# Patient Record
Sex: Female | Born: 1985 | Race: Asian | Hispanic: No
Health system: Southern US, Community
[De-identification: ages and names within clinical notes are randomized; demographics above are authoritative.]

## PROBLEM LIST (undated history)

## (undated) DIAGNOSIS — E7800 Pure hypercholesterolemia, unspecified: Secondary | ICD-10-CM

## (undated) DIAGNOSIS — G43909 Migraine, unspecified, not intractable, without status migrainosus: Secondary | ICD-10-CM

## (undated) DIAGNOSIS — Z8742 Personal history of other diseases of the female genital tract: Secondary | ICD-10-CM

## (undated) HISTORY — DX: Personal history of other diseases of the female genital tract: Z87.42

## (undated) HISTORY — DX: Migraine, unspecified, not intractable, without status migrainosus: G43.909

## (undated) HISTORY — DX: Pure hypercholesterolemia, unspecified: E78.00

---
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
## 2008-12-12 ENCOUNTER — Inpatient Hospital Stay (HOSPITAL_COMMUNITY): Admission: AD | Admit: 2008-12-12 | Discharge: 2008-12-15 | Payer: Self-pay | Admitting: Obstetrics and Gynecology

## 2008-12-12 ENCOUNTER — Inpatient Hospital Stay (HOSPITAL_COMMUNITY): Admission: AD | Admit: 2008-12-12 | Discharge: 2008-12-12 | Payer: Self-pay | Admitting: Obstetrics & Gynecology

## 2010-05-29 LAB — CBC
HCT: 39.7 % (ref 36.0–46.0)
Hemoglobin: 11.1 g/dL — ABNORMAL LOW (ref 12.0–15.0)
Hemoglobin: 13.2 g/dL (ref 12.0–15.0)
MCHC: 33.2 g/dL (ref 30.0–36.0)
MCV: 91.7 fL (ref 78.0–100.0)
Platelets: 178 K/uL (ref 150–400)
RBC: 3.57 MIL/uL — ABNORMAL LOW (ref 3.87–5.11)
RBC: 4.33 MIL/uL (ref 3.87–5.11)
RDW: 13 % (ref 11.5–15.5)
WBC: 15.3 K/uL — ABNORMAL HIGH (ref 4.0–10.5)

## 2010-05-29 LAB — RPR: RPR Ser Ql: NONREACTIVE

## 2015-12-12 DIAGNOSIS — Z23 Encounter for immunization: Secondary | ICD-10-CM | POA: Diagnosis not present

## 2017-02-23 DIAGNOSIS — E7800 Pure hypercholesterolemia, unspecified: Secondary | ICD-10-CM

## 2017-02-23 HISTORY — DX: Pure hypercholesterolemia, unspecified: E78.00

## 2017-05-17 ENCOUNTER — Encounter: Payer: Self-pay | Admitting: *Deleted

## 2017-07-17 ENCOUNTER — Encounter: Payer: Self-pay | Admitting: Obstetrics and Gynecology

## 2017-07-23 ENCOUNTER — Encounter: Payer: Self-pay | Admitting: Obstetrics and Gynecology

## 2017-07-23 ENCOUNTER — Other Ambulatory Visit: Payer: Self-pay

## 2017-07-23 ENCOUNTER — Ambulatory Visit (INDEPENDENT_AMBULATORY_CARE_PROVIDER_SITE_OTHER): Payer: No Typology Code available for payment source | Admitting: Obstetrics and Gynecology

## 2017-07-23 VITALS — BP 118/62 | HR 60 | Resp 14 | Ht 63.25 in | Wt 145.0 lb

## 2017-07-23 DIAGNOSIS — Z01419 Encounter for gynecological examination (general) (routine) without abnormal findings: Secondary | ICD-10-CM | POA: Diagnosis not present

## 2017-07-23 DIAGNOSIS — Z113 Encounter for screening for infections with a predominantly sexual mode of transmission: Secondary | ICD-10-CM

## 2017-07-23 DIAGNOSIS — N926 Irregular menstruation, unspecified: Secondary | ICD-10-CM

## 2017-07-23 NOTE — Progress Notes (Signed)
32 y.o. G44P0001 Married [place] female here as a new patient for an annual exam. Patient has seen Dr. Quincy Simmonds in the past at [facility]. Patient complains of having vaginal discharge. Having odor.   Some irregular menses.  Had 5 - 8 menses this year.  This is long standing for the patient since her teen years.  Took 10 months to become pregnant.  Having cramping in the left groin area.  States it feels like a muscle.  States a history of hematuria, which resolved.  This was actually a UTI treated at urgent care.  Sits at a computer for work.  Son is now 56 yo.  Patient is married for one year.  Considering future childbearing.   Prior husband was unfaithful.  Did not have STD screening following this that she can confirm.   PCP: No PCP     Patient's last menstrual period was 07/17/2017.     Period Cycle (Days): (irregular) Period Duration (Days): 4-6 Period Pattern: (!) Irregular Menstrual Flow: Moderate, Heavy Menstrual Control: Maxi pad Menstrual Control Change Freq (Hours): 4-5 Dysmenorrhea: (!) Mild Dysmenorrhea Symptoms: Cramping     Sexually active: Yes.    The current method of family planning is none.    Exercising: Yes.    walking Smoker:  no  Health Maintenance: Pap:  Years ago - normal per patient  History of abnormal Pap:  no TDaP:  2017 per patient Gardasil:   unsure HIV: negative in pregnancy Hep C: unsure Screening Labs:  Discuss today   reports that she is a non-smoker but has been exposed to tobacco smoke. She has never used smokeless tobacco. She reports that she drank alcohol. She reports that she does not use drugs.  History reviewed. No pertinent past medical history.  History reviewed. No pertinent surgical history.  No current outpatient medications on file.   No current facility-administered medications for this visit.     Family History  Problem Relation Age of Onset  . Arthritis Father   . Polycystic ovary syndrome Sister   .  Polycystic ovary syndrome Sister   . Goiter Sister   . Thyroid disease Sister     Review of Systems  Constitutional: Negative.   HENT: Negative.   Eyes: Negative.   Respiratory: Negative.   Cardiovascular: Negative.   Gastrointestinal: Negative.   Endocrine: Negative.   Genitourinary: Positive for vaginal discharge.  Musculoskeletal: Negative.   Skin: Negative.   Allergic/Immunologic: Negative.   Neurological: Negative.   Hematological: Negative.   Psychiatric/Behavioral: Negative.     Exam:   BP 118/62 (BP Location: Right Arm, Patient Position: Sitting, Cuff Size: Normal)   Pulse 60   Resp 14   Ht 5' 3.25" (1.607 m)   Wt 145 lb (65.8 kg)   LMP 07/17/2017   BMI 25.48 kg/m     General appearance: alert, cooperative and appears stated age Head: Normocephalic, without obvious abnormality, atraumatic Neck: no adenopathy, supple, symmetrical, trachea midline and thyroid normal to inspection and palpation Lungs: clear to auscultation bilaterally Breasts: normal appearance, no masses or tenderness, No nipple retraction or dimpling, No nipple discharge or bleeding, No axillary or supraclavicular adenopathy Heart: regular rate and rhythm Abdomen: soft, non-tender; no masses, no organomegaly Extremities: extremities normal, atraumatic, no cyanosis or edema Skin: Skin color, texture, turgor normal. No rashes or lesions Lymph nodes: Cervical, supraclavicular, and axillary nodes normal. No abnormal inguinal nodes palpated Neurologic: Grossly normal  Pelvic: External genitalia:  no lesions  Urethra:  normal appearing urethra with no masses, tenderness or lesions              Bartholins and Skenes: normal                 Vagina: normal appearing vagina with normal color and discharge, no lesions              Cervix:  Not well seen due to heavy bleeding from menses.               Pap taken: No. Bimanual Exam:  Uterus:  normal size, contour, position, consistency,  mobility, non-tender              Adnexa: no mass, fullness, tenderness              Limited bimanual exam due to some discomfort.                Chaperone was present for exam.  Assessment:   Well woman visit with normal exam. Vaginal discharge.  Irregular menses.  STD screening.  Considering future childbearing.   Plan: Mammogram screening. Recommended self breast awareness. Pap and HR HPV with GC/CT/vaginitis testing upon return in one week.  Guidelines for Calcium, Vitamin D, regular exercise program including cardiovascular and weight bearing exercise. Start PNV.  Serum STD testing, routine labs, prolactin, TSH, LH/FSH.  Discussed Gardasil. Return in one week for pap/vaginitis and GC/CT evaluation.  Follow up annually and prn.   After visit summary provided.

## 2017-07-23 NOTE — Patient Instructions (Signed)

## 2017-07-24 LAB — COMPREHENSIVE METABOLIC PANEL
ALK PHOS: 70 IU/L (ref 39–117)
ALT: 18 IU/L (ref 0–32)
AST: 17 IU/L (ref 0–40)
Albumin/Globulin Ratio: 1.4 (ref 1.2–2.2)
Albumin: 4.6 g/dL (ref 3.5–5.5)
BILIRUBIN TOTAL: 0.5 mg/dL (ref 0.0–1.2)
BUN/Creatinine Ratio: 18 (ref 9–23)
BUN: 14 mg/dL (ref 6–20)
CHLORIDE: 101 mmol/L (ref 96–106)
CO2: 23 mmol/L (ref 20–29)
CREATININE: 0.77 mg/dL (ref 0.57–1.00)
Calcium: 9.6 mg/dL (ref 8.7–10.2)
GFR calc Af Amer: 120 mL/min/{1.73_m2} (ref >59)
GFR calc non Af Amer: 104 mL/min/{1.73_m2} (ref >59)
GLUCOSE: 93 mg/dL (ref 65–99)
Globulin, Total: 3.3 g/dL (ref 1.5–4.5)
Potassium: 4.1 mmol/L (ref 3.5–5.2)
Sodium: 139 mmol/L (ref 134–144)
Total Protein: 7.9 g/dL (ref 6.0–8.5)

## 2017-07-24 LAB — LIPID PANEL
CHOL/HDL RATIO: 5.1 ratio — AB (ref 0.0–4.4)
Cholesterol, Total: 208 mg/dL — ABNORMAL HIGH (ref 100–199)
HDL: 41 mg/dL (ref >39)
LDL CALC: 141 mg/dL — AB (ref 0–99)
TRIGLYCERIDES: 131 mg/dL (ref 0–149)
VLDL CHOLESTEROL CAL: 26 mg/dL (ref 5–40)

## 2017-07-24 LAB — CBC
Hematocrit: 40.7 % (ref 34.0–46.6)
Hemoglobin: 13.6 g/dL (ref 11.1–15.9)
MCH: 28.1 pg (ref 26.6–33.0)
MCHC: 33.4 g/dL (ref 31.5–35.7)
MCV: 84 fL (ref 79–97)
PLATELETS: 371 10*3/uL (ref 150–450)
RBC: 4.84 x10E6/uL (ref 3.77–5.28)
RDW: 13.9 % (ref 12.3–15.4)
WBC: 9.2 10*3/uL (ref 3.4–10.8)

## 2017-07-24 LAB — FSH/LH
FSH: 5.1 m[IU]/mL
LH: 14.5 m[IU]/mL

## 2017-07-24 LAB — TSH: TSH: 1.72 u[IU]/mL (ref 0.450–4.500)

## 2017-07-24 LAB — HEPATITIS C ANTIBODY: Hep C Virus Ab: <0.1 s/co ratio (ref 0.0–0.9)

## 2017-07-24 LAB — HEP, RPR, HIV PANEL
HIV Screen 4th Generation wRfx: NONREACTIVE
Hepatitis B Surface Ag: NEGATIVE
RPR Ser Ql: NONREACTIVE

## 2017-07-24 LAB — PROLACTIN: Prolactin: 11.3 ng/mL (ref 4.8–23.3)

## 2017-07-26 ENCOUNTER — Telehealth: Payer: Self-pay | Admitting: Obstetrics and Gynecology

## 2017-07-26 ENCOUNTER — Encounter: Payer: Self-pay | Admitting: Obstetrics and Gynecology

## 2017-07-26 NOTE — Telephone Encounter (Signed)
Routing to Dr.Silva for review of results from 07/23/2017.

## 2017-07-26 NOTE — Telephone Encounter (Signed)
-----   Message from [place], Generic sent at 07/26/2017 10:09 AM EDT -----    Good morning. Is there an update of my lab result? Thank you.

## 2017-07-26 NOTE — Telephone Encounter (Signed)
Results released to patient through My Chart now.

## 2017-07-28 ENCOUNTER — Encounter: Payer: Self-pay | Admitting: Obstetrics and Gynecology

## 2017-07-29 ENCOUNTER — Telehealth: Payer: Self-pay | Admitting: Obstetrics and Gynecology

## 2017-07-29 NOTE — Telephone Encounter (Signed)
Left message to call Eugene Isadore at [phone]. 

## 2017-07-29 NOTE — Telephone Encounter (Signed)
Message   ----- Message from [place], Generic sent at 07/28/2017 10:02 PM EDT -----    Good evening Dr. Quincy Simmonds, til now I have period but its not heavy, should I be concern? This is the longest period I have. Thank you :)

## 2017-07-29 NOTE — Telephone Encounter (Addendum)
Spoke with patient. Patient states that she started her menses on 07/17/2017. Is still having light bleeding. Reports bleeding is a light spotting. Denies any heavy bleeding. Today is day 13 of menses. States it seems like it will stop today. Not on birth control. Advised to take UPT if chance for pregnancy. Advised to monitor and if bleeding becomes heavy or does not stop to contact the office. Patient has an appointment for a pap smear on 08/04/2017. Advised if still bleeding will need to contact the office to reschedule. Advised will review with Dr.Silva and return call with any additional recommendations.

## 2017-07-30 NOTE — Telephone Encounter (Signed)
I agree with your recommendations.  You may close the encounter.  

## 2017-08-04 ENCOUNTER — Encounter: Payer: Self-pay | Admitting: Obstetrics and Gynecology

## 2017-08-04 ENCOUNTER — Ambulatory Visit (INDEPENDENT_AMBULATORY_CARE_PROVIDER_SITE_OTHER): Payer: No Typology Code available for payment source | Admitting: Obstetrics and Gynecology

## 2017-08-04 ENCOUNTER — Other Ambulatory Visit (HOSPITAL_COMMUNITY)
Admission: RE | Admit: 2017-08-04 | Discharge: 2017-08-04 | Disposition: A | Discharge status: Home / Self Care | Payer: No Typology Code available for payment source | Source: Ambulatory Visit | Attending: Obstetrics and Gynecology | Admitting: Obstetrics and Gynecology

## 2017-08-04 ENCOUNTER — Other Ambulatory Visit: Payer: Self-pay

## 2017-08-04 VITALS — BP 108/60 | HR 76 | Resp 16 | Ht 63.25 in | Wt 143.0 lb

## 2017-08-04 DIAGNOSIS — N898 Other specified noninflammatory disorders of vagina: Secondary | ICD-10-CM | POA: Diagnosis not present

## 2017-08-04 DIAGNOSIS — Z124 Encounter for screening for malignant neoplasm of cervix: Secondary | ICD-10-CM | POA: Diagnosis not present

## 2017-08-04 DIAGNOSIS — E7800 Pure hypercholesterolemia, unspecified: Secondary | ICD-10-CM

## 2017-08-04 DIAGNOSIS — Z1151 Encounter for screening for human papillomavirus (HPV): Secondary | ICD-10-CM | POA: Insufficient documentation

## 2017-08-04 DIAGNOSIS — Z113 Encounter for screening for infections with a predominantly sexual mode of transmission: Secondary | ICD-10-CM | POA: Diagnosis not present

## 2017-08-04 NOTE — Progress Notes (Signed)
GYNECOLOGY  VISIT   HPI: 32 y.o.   Married  Asian  female   Julia Carter with Patient's last menstrual period was 07/17/2017.   here for pap smear; patient complains of having discharge with odor. Feeling anxious about potential reasons.    Needs STD screening and vaginitis screening today.  Notes some vaginal discharge and odor following sex.   Last menses lasted for 2 weeks.  It was not heavy.  She had her period at the time of her annual exam so the above testing could not be completed then.  Desire for future pregnancy. Long hx of irregular menses.  Was able to achieve prior pregnancy spontaneously.   Asking about elevated cholesterol and proper diet.  Trying to make some changes in her food choices.  Has tended to eat fried foods and fatty meat.  Likes eggs.   GYNECOLOGIC HISTORY: Patient's last menstrual period was 07/17/2017. Contraception:  none Menopausal hormone therapy:  n/a Last mammogram:  n/a Last pap smear:   Years ago -- normal per patient        OB History    Gravida  1   Para  1   Term  0   Preterm  0   AB  0   Living  1     SAB  0   TAB  0   Ectopic  0   Multiple  0   Live Births  0              Patient Active Problem List   Diagnosis Date Noted  . Irregular menses 07/23/2017    Past Medical History:  Diagnosis Date  . Elevated cholesterol 2019    History reviewed. No pertinent surgical history.  No current outpatient medications on file.   No current facility-administered medications for this visit.      ALLERGIES: Other  Family History  Problem Relation Age of Onset  . Arthritis Father   . Polycystic ovary syndrome Sister   . Polycystic ovary syndrome Sister   . Goiter Sister   . Thyroid disease Sister     Social History   Socioeconomic History  . Marital status: Married    Spouse name: Not on file  . Number of children: Not on file  . Years of education: Not on file  . Highest education level: Not on file   Occupational History  . Not on file  Social Needs  . Financial resource strain: Not on file  . Food insecurity:    Worry: Not on file    Inability: Not on file  . Transportation needs:    Medical: Not on file    Non-medical: Not on file  Tobacco Use  . Smoking status: Passive Smoke Exposure - Never Smoker  . Smokeless tobacco: Never Used  Substance and Sexual Activity  . Alcohol use: Not Currently  . Drug use: Never  . Sexual activity: Yes    Birth control/protection: None  Lifestyle  . Physical activity:    Days per week: Not on file    Minutes per session: Not on file  . Stress: Not on file  Relationships  . Social connections:    Talks on phone: Not on file    Gets together: Not on file    Attends religious service: Not on file    Active member of club or organization: Not on file    Attends meetings of clubs or organizations: Not on file    Relationship status: Not on  file  . Intimate partner violence:    Fear of current or ex partner: Not on file    Emotionally abused: Not on file    Physically abused: Not on file    Forced sexual activity: Not on file  Other Topics Concern  . Not on file  Social History Narrative  . Not on file    Review of Systems  Constitutional: Negative.   HENT: Negative.   Eyes: Negative.   Respiratory: Negative.   Cardiovascular: Negative.   Gastrointestinal: Negative.   Endocrine: Negative.   Genitourinary: Positive for vaginal discharge.  Musculoskeletal: Negative.   Skin: Negative.   Allergic/Immunologic: Negative.   Neurological: Negative.   Hematological: Negative.   Psychiatric/Behavioral: Negative.     PHYSICAL EXAMINATION:    BP 108/60 (BP Location: Right Arm, Patient Position: Sitting, Cuff Size: Normal)   Pulse 76   Resp 16   Ht 5' 3.25" (1.607 m)   Wt 143 lb (64.9 kg)   LMP 07/17/2017   BMI 25.13 kg/m     General appearance: alert, cooperative and appears stated age  Pelvic: External genitalia:  no  lesions              Urethra:  normal appearing urethra with no masses, tenderness or lesions              Bartholins and Skenes: normal                 Vagina: normal appearing vagina with normal color and discharge, no lesions              Cervix: no lesions                Bimanual Exam:  Uterus:  normal size, contour, position, consistency, mobility, non-tender              Adnexa: no mass, fullness, tenderness               Chaperone was present for exam.  ASSESSMENT  Elevated cholesterol.  Anovulatory cycle.  STD and vaginitis testing.  Cervical cancer screening.   PLAN  Pap, vaginitis screening, GC/CT. We discussed anovulatory bleeding.  She will call if she experiences continued prolonged cycles.  We reviewed cholesterol lowering diet to reduce future risk of cardiovascular disease.  This can be tested yearly.    An After Visit Summary was printed and given to the patient.  __25___ minutes face to face time of which over 50% was spent in counseling.

## 2017-08-04 NOTE — Patient Instructions (Addendum)
Try to Health Heart Cookbook from the [employer] Association.    Fat and Cholesterol Restricted Diet Getting too much fat and cholesterol in your diet may cause health problems. Following this diet helps keep your fat and cholesterol at normal levels. This can keep you from getting sick. What types of fat should I choose?  Choose monosaturated and polyunsaturated fats. These are found in foods such as olive oil, canola oil, flaxseeds, walnuts, almonds, and seeds.  Eat more omega-3 fats. Good choices include salmon, mackerel, sardines, tuna, flaxseed oil, and ground flaxseeds.  Limit saturated fats. These are in animal products such as meats, butter, and cream. They can also be in plant products such as palm oil, palm kernel oil, and coconut oil.  Avoid foods with partially hydrogenated oils in them. These contain trans fats. Examples of foods that have trans fats are stick margarine, some tub margarines, cookies, crackers, and other baked goods. What general guidelines do I need to follow?  Check food labels. Look for the words "trans fat" and "saturated fat."  When preparing a meal: ? Fill half of your plate with vegetables and green salads. ? Fill one fourth of your plate with whole grains. Look for the word "whole" as the first word in the ingredient list. ? Fill one fourth of your plate with lean protein foods.  Eat more foods that have fiber, like apples, carrots, beans, peas, and barley.  Eat more home-cooked foods. Eat less at restaurants and buffets.  Limit or avoid alcohol.  Limit foods high in starch and sugar.  Limit fried foods.  Cook foods without frying them. Baking, boiling, grilling, and broiling are all great options.  Lose weight if you are overweight. Losing even a small amount of weight can help your overall health. It can also help prevent diseases such as diabetes and heart disease. What foods can I eat? Grains Whole grains, such as whole wheat or whole  grain breads, crackers, cereals, and pasta. Unsweetened oatmeal, bulgur, barley, quinoa, or brown rice. Corn or whole wheat flour tortillas. Vegetables Fresh or frozen vegetables (raw, steamed, roasted, or grilled). Green salads. Fruits All fresh, canned (in natural juice), or frozen fruits. Meat and Other Protein Products Ground beef (85% or leaner), grass-fed beef, or beef trimmed of fat. Skinless chicken or [place]. Ground chicken or [place]. Pork trimmed of fat. All fish and seafood. Eggs. Dried beans, peas, or lentils. Unsalted nuts or seeds. Unsalted canned or dry beans. Dairy Low-fat dairy products, such as skim or 1% milk, 2% or reduced-fat cheeses, low-fat ricotta or cottage cheese, or plain low-fat yogurt. Fats and Oils Tub margarines without trans fats. Light or reduced-fat mayonnaise and salad dressings. Avocado. Olive, canola, sesame, or safflower oils. Natural peanut or almond butter (choose ones without added sugar and oil). The items listed above may not be a complete list of recommended foods or beverages. Contact your dietitian for more options. What foods are not recommended? Grains White bread. White pasta. White rice. Cornbread. Bagels, pastries, and croissants. Crackers that contain trans fat. Vegetables White potatoes. Corn. Creamed or fried vegetables. Vegetables in a cheese sauce. Fruits Dried fruits. Canned fruit in light or heavy syrup. Fruit juice. Meat and Other Protein Products Fatty cuts of meat. Ribs, chicken wings, bacon, sausage, bologna, salami, chitterlings, fatback, hot dogs, bratwurst, and packaged luncheon meats. Liver and organ meats. Dairy Whole or 2% milk, cream, half-and-half, and cream cheese. Whole milk cheeses. Whole-fat or sweetened yogurt. Full-fat cheeses. Nondairy creamers and  whipped toppings. Processed cheese, cheese spreads, or cheese curds. Sweets and Desserts Corn syrup, sugars, honey, and molasses. Candy. Jam and jelly. Syrup. Sweetened  cereals. Cookies, pies, cakes, donuts, muffins, and ice cream. Fats and Oils Butter, stick margarine, lard, shortening, ghee, or bacon fat. Coconut, palm kernel, or palm oils. Beverages Alcohol. Sweetened drinks (such as sodas, lemonade, and fruit drinks or punches). The items listed above may not be a complete list of foods and beverages to avoid. Contact your dietitian for more information. This information is not intended to replace advice given to you by your health care provider. Make sure you discuss any questions you have with your health care provider. Document Released: 08/11/2011 Document Revised: 10/17/2015 Document Reviewed: 05/11/2013 Elsevier Interactive Patient Education  2018 [employer].  High Cholesterol High cholesterol is a condition in which the blood has high levels of a white, waxy, fat-like substance (cholesterol). The human body needs small amounts of cholesterol. The liver makes all the cholesterol that the body needs. Extra (excess) cholesterol comes from the food that we eat. Cholesterol is carried from the liver by the blood through the blood vessels. If you have high cholesterol, deposits (plaques) may build up on the walls of your blood vessels (arteries). Plaques make the arteries narrower and stiffer. Cholesterol plaques increase your risk for heart attack and stroke. Work with your health care provider to keep your cholesterol levels in a healthy range. What increases the risk? This condition is more likely to develop in people who:  Eat foods that are high in animal fat (saturated fat) or cholesterol.  Are overweight.  Are not getting enough exercise.  Have a family history of high cholesterol.  What are the signs or symptoms? There are no symptoms of this condition. How is this diagnosed? This condition may be diagnosed from the results of a blood test.  If you are older than age 57, your health care provider may check your cholesterol every 4-6  years.  You may be checked more often if you already have high cholesterol or other risk factors for heart disease.  The blood test for cholesterol measures:  "Bad" cholesterol (LDL cholesterol). This is the main type of cholesterol that causes heart disease. The desired level for LDL is less than 100.  "Good" cholesterol (HDL cholesterol). This type helps to protect against heart disease by cleaning the arteries and carrying the LDL away. The desired level for HDL is 60 or higher.  Triglycerides. These are fats that the body can store or burn for energy. The desired number for triglycerides is lower than 150.  Total cholesterol. This is a measure of the total amount of cholesterol in your blood, including LDL cholesterol, HDL cholesterol, and triglycerides. A healthy number is less than 200.  How is this treated? This condition is treated with diet changes, lifestyle changes, and medicines. Diet changes  This may include eating more whole grains, fruits, vegetables, nuts, and fish.  This may also include cutting back on red meat and foods that have a lot of added sugar. Lifestyle changes  Changes may include getting at least 40 minutes of aerobic exercise 3 times a week. Aerobic exercises include walking, biking, and swimming. Aerobic exercise along with a healthy diet can help you maintain a healthy weight.  Changes may also include quitting smoking. Medicines  Medicines are usually given if diet and lifestyle changes have failed to reduce your cholesterol to healthy levels.  Your health care provider may prescribe a  statin medicine. Statin medicines have been shown to reduce cholesterol, which can reduce the risk of heart disease. Follow these instructions at home: Eating and drinking  If told by your health care provider:  Eat chicken (without skin), fish, veal, shellfish, ground [place] breast, and round or loin cuts of red meat.  Do not eat fried foods or fatty meats, such  as hot dogs and salami.  Eat plenty of fruits, such as apples.  Eat plenty of vegetables, such as broccoli, potatoes, and carrots.  Eat beans, peas, and lentils.  Eat grains such as barley, rice, couscous, and bulgur wheat.  Eat pasta without cream sauces.  Use skim or nonfat milk, and eat low-fat or nonfat yogurt and cheeses.  Do not eat or drink whole milk, cream, ice cream, egg yolks, or hard cheeses.  Do not eat stick margarine or tub margarines that contain trans fats (also called partially hydrogenated oils).  Do not eat saturated tropical oils, such as coconut oil and palm oil.  Do not eat cakes, cookies, crackers, or other baked goods that contain trans fats.  General instructions  Exercise as directed by your health care provider. Increase your activity level with activities such as gardening, walking, and taking the stairs.  Take over-the-counter and prescription medicines only as told by your health care provider.  Do not use any products that contain nicotine or tobacco, such as cigarettes and e-cigarettes. If you need help quitting, ask your health care provider.  Keep all follow-up visits as told by your health care provider. This is important. Contact a health care provider if:  You are struggling to maintain a healthy diet or weight.  You need help to start on an exercise program.  You need help to stop smoking. Get help right away if:  You have chest pain.  You have trouble breathing. This information is not intended to replace advice given to you by your health care provider. Make sure you discuss any questions you have with your health care provider. Document Released: 02/09/2005 Document Revised: 09/07/2015 Document Reviewed: 08/10/2015 Elsevier Interactive Patient Education  2018 [employer].   Vaginitis Vaginitis is a condition in which the vaginal tissue swells and becomes red (inflamed). This condition is most often caused by a change in the  normal balance of bacteria and yeast that live in the vagina. This change causes an overgrowth of certain bacteria or yeast, which causes the inflammation. There are different types of vaginitis, but the most common types are:  Bacterial vaginosis.  Yeast infection (candidiasis).  Trichomoniasis vaginitis. This is a sexually transmitted disease (STD).  Viral vaginitis.  Atrophic vaginitis.  Allergic vaginitis.  What are the causes? The cause of this condition depends on the type of vaginitis. It can be caused by:  Bacteria (bacterial vaginosis).  Yeast, which is a fungus (yeast infection).  A parasite (trichomoniasis vaginitis).  A virus (viral vaginitis).  Low hormone levels (atrophic vaginitis). Low hormone levels can occur during pregnancy, breastfeeding, or after menopause.  Irritants, such as bubble baths, scented tampons, and feminine sprays (allergic vaginitis).  Other factors can change the normal balance of the yeast and bacteria that live in the vagina. These include:  Antibiotic medicines.  Poor hygiene.  Diaphragms, vaginal sponges, spermicides, birth control pills, and intrauterine devices (IUD).  Sex.  Infection.  Uncontrolled diabetes.  A weakened defense (immune) system.  What increases the risk? This condition is more likely to develop in women who:  Smoke.  Use vaginal  douches, scented tampons, or scented sanitary pads.  Wear tight-fitting pants.  Wear thong underwear.  Use oral birth control pills or an IUD.  Have sex without a condom.  Have multiple sex partners.  Have an STD.  Frequently use the spermicide nonoxynol-9.  Eat lots of foods high in sugar.  Have uncontrolled diabetes.  Have low estrogen levels.  Have a weakened immune system from an immune disorder or medical treatment.  Are pregnant or breastfeeding.  What are the signs or symptoms? Symptoms vary depending on the cause of the vaginitis. Common symptoms  include:  Abnormal vaginal discharge. ? The discharge is white, gray, or yellow with bacterial vaginosis. ? The discharge is thick, white, and cheesy with a yeast infection. ? The discharge is frothy and yellow or greenish with trichomoniasis.  A bad vaginal smell. The smell is fishy with bacterial vaginosis.  Vaginal itching, pain, or swelling.  Sex that is painful.  Pain or burning when urinating.  Sometimes there are no symptoms. How is this diagnosed? This condition is diagnosed based on your symptoms and medical history. A physical exam, including a pelvic exam, will also be done. You may also have other tests, including:  Tests to determine the pH level (acidity or alkalinity) of your vagina.  A whiff test, to assess the odor that results when a sample of your vaginal discharge is mixed with a potassium hydroxide solution.  Tests of vaginal fluid. A sample will be examined under a microscope.  How is this treated? Treatment varies depending on the type of vaginitis you have. Your treatment may include:  Antibiotic creams or pills to treat bacterial vaginosis and trichomoniasis.  Antifungal medicines, such as vaginal creams or suppositories, to treat a yeast infection.  Medicine to ease discomfort if you have viral vaginitis. Your sexual partner should also be treated.  Estrogen delivered in a cream, pill, suppository, or vaginal ring to treat atrophic vaginitis. If vaginal dryness occurs, lubricants and moisturizing creams may help. You may need to avoid scented soaps, sprays, or douches.  Stopping use of a product that is causing allergic vaginitis. Then using a vaginal cream to treat the symptoms.  Follow these instructions at home: Lifestyle  Keep your genital area clean and dry. Avoid soap, and only rinse the area with water.  Do not douche or use tampons until your health care provider says it is okay to do so. Use sanitary pads, if needed.  Do not have sex  until your health care provider approves. When you can return to sex, practice safe sex and use condoms.  Wipe from front to back. This avoids the spread of bacteria from the rectum to the vagina. General instructions  Take over-the-counter and prescription medicines only as told by your health care provider.  If you were prescribed an antibiotic medicine, take or use it as told by your health care provider. Do not stop taking or using the antibiotic even if you start to feel better.  Keep all follow-up visits as told by your health care provider. This is important. How is this prevented?  Use mild, non-scented products. Do not use things that can irritate the vagina, such as fabric softeners. Avoid the following products if they are scented: ? Feminine sprays. ? Detergents. ? Tampons. ? Feminine hygiene products. ? Soaps or bubble baths.  Let air reach your genital area. ? Wear cotton underwear to reduce moisture buildup. ? Avoid wearing underwear while you sleep. ? Avoid wearing tight pants  and underwear or nylons without a cotton panel. ? Avoid wearing thong underwear.  Take off any wet clothing, such as bathing suits, as soon as possible.  Practice safe sex and use condoms. Contact a health care provider if:  You have abdominal pain.  You have a fever.  You have symptoms that last for more than 2-3 days. Get help right away if:  You have a fever and your symptoms suddenly get worse. Summary  Vaginitis is a condition in which the vaginal tissue becomes inflamed.This condition is most often caused by a change in the normal balance of bacteria and yeast that live in the vagina.  Treatment varies depending on the type of vaginitis you have.  Do not douche, use tampons , or have sex until your health care provider approves. When you can return to sex, practice safe sex and use condoms. This information is not intended to replace advice given to you by your health care  provider. Make sure you discuss any questions you have with your health care provider. Document Released: 12/07/2006 Document Revised: 03/17/2016 Document Reviewed: 03/17/2016 Elsevier Interactive Patient Education  [employer].

## 2017-08-06 LAB — CYTOLOGY - PAP
Bacterial vaginitis: POSITIVE — AB
Candida vaginitis: NEGATIVE
Chlamydia: NEGATIVE
Diagnosis: NEGATIVE
HPV (WINDOPATH): NOT DETECTED
NEISSERIA GONORRHEA: NEGATIVE
Trichomonas: NEGATIVE

## 2017-08-10 ENCOUNTER — Encounter: Payer: Self-pay | Admitting: Obstetrics and Gynecology

## 2017-08-10 ENCOUNTER — Telehealth: Payer: Self-pay | Admitting: Obstetrics and Gynecology

## 2017-08-10 MED ORDER — METRONIDAZOLE 500 MG PO TABS: 500.0000 mg | ORAL_TABLET | Freq: Two times a day (BID) | ORAL | 0 refills | Status: DC

## 2017-08-10 NOTE — Telephone Encounter (Signed)
Left message to call [place] at [phone].  Notes recorded by Nunzio Cobbs, MD on 08/06/2017 at 5:09 PM EDT Please contact patient with results.   She has bacterial vaginosis. She may treat with Flagyl 500 mg po bid for 7 days or Metrogel pv at hs for 5 nights.  Please send Rx to pharmacy of choice. ETOH precautions.   She tested negative for yeast, trichomonas, chlamydia, and gonorrhea.   Her pap is normal and HR HPV negative.  Pap recall - 02.

## 2017-08-10 NOTE — Telephone Encounter (Signed)
-----   Message from [place], Generic sent at 08/10/2017 9:29 AM EDT -----    Good morning do I have result for my papsmear? Thank you.

## 2017-08-10 NOTE — Telephone Encounter (Signed)
Spoke with patient. Results given. Patient verbalizes understanding. Flagyl 500 mg po BID x 7 days #14 0RF sent to pharmacy on file. Avoid alcohol during treatment and 24 hours after completing medication. Don't mix with alcohol if mixed can cause severe nausea, vomiting and abdominal cramping. Patient verbalizes understanding. 02 recall entered. Encounter closed.

## 2017-08-12 ENCOUNTER — Telehealth: Payer: Self-pay | Admitting: Obstetrics and Gynecology

## 2017-08-12 ENCOUNTER — Encounter: Payer: Self-pay | Admitting: Obstetrics and Gynecology

## 2017-08-12 NOTE — Telephone Encounter (Signed)
Spoke with patient and advised her to complete Flagyl before resuming intercourse.

## 2017-08-12 NOTE — Telephone Encounter (Signed)
-----   Message from [place], Generic sent at 08/12/2017 10:09 AM EDT -----    Good morning, is intercourse safe while taking this medication? Thank you.

## 2017-08-12 NOTE — Telephone Encounter (Signed)
Called patient and left message on voicemail to return my call.

## 2017-08-13 ENCOUNTER — Telehealth: Payer: Self-pay | Admitting: Obstetrics and Gynecology

## 2017-08-13 NOTE — Telephone Encounter (Signed)
Patient is questioning if the symptoms stop before she completes the medicine, if she can stop taking it.

## 2017-08-13 NOTE — Telephone Encounter (Signed)
Spoke with patient. Taking Flagyl po for BV, asking if ok to stop medication if symptoms have resolved?   Advised patient to complete medication. It's important to take all of the medication, even if you are feeling better. If treatment stops too soon, may not kill all the bacteria.  Patient verbalizes understanding, will complete course of Flagyl.  Routing to provider for final review. Patient is agreeable to disposition. Will close encounter.

## 2017-08-19 ENCOUNTER — Encounter: Payer: Self-pay | Admitting: Obstetrics and Gynecology

## 2017-09-22 ENCOUNTER — Ambulatory Visit: Payer: Self-pay | Admitting: Family Medicine

## 2017-11-24 ENCOUNTER — Ambulatory Visit: Payer: No Typology Code available for payment source | Admitting: Osteopathic Medicine

## 2017-12-08 ENCOUNTER — Ambulatory Visit: Payer: No Typology Code available for payment source | Admitting: Osteopathic Medicine

## 2017-12-09 ENCOUNTER — Telehealth: Payer: Self-pay | Admitting: Obstetrics and Gynecology

## 2017-12-09 ENCOUNTER — Encounter: Payer: Self-pay | Admitting: Obstetrics and Gynecology

## 2017-12-09 NOTE — Telephone Encounter (Signed)
Spoke with patient. She has a history if irregular cycles and anovulatory bleeding.  She states she had a "very heavy" cycle in July and for the Months of August, September, and October she had only three days of very light spotting. She took a pregnancy test in September that was negative.  Denies pain or pregnancy symptoms.  Advised office visit indicated for irregular cycles. Pt advised to take home pregnancy test and call back if positive, if negative, keep appointment as scheduled. Bleeding precautions discussed. Pt not currently having any bleeding. Feels well, not dizzy or weak.  Pt agreeable to plan. Has office visit Wednesday 12/15/2017. Will call back with any concerns prior to appoinment.    Routing to provider and will close encounter.

## 2017-12-09 NOTE — Telephone Encounter (Signed)
Patient sent the following message through [place]. Routing to triage to assist patient with request.   I did not have a period for 2 months and half and only few red spot should i be concern?

## 2017-12-15 ENCOUNTER — Encounter: Payer: Self-pay | Admitting: Obstetrics and Gynecology

## 2017-12-15 ENCOUNTER — Emergency Department (INDEPENDENT_AMBULATORY_CARE_PROVIDER_SITE_OTHER)
Admission: EM | Admit: 2017-12-15 | Discharge: 2017-12-15 | Disposition: A | Discharge status: Home / Self Care | Payer: No Typology Code available for payment source | Source: Home / Self Care | Attending: Family Medicine | Admitting: Family Medicine

## 2017-12-15 ENCOUNTER — Encounter: Payer: Self-pay | Admitting: Osteopathic Medicine

## 2017-12-15 ENCOUNTER — Other Ambulatory Visit: Payer: Self-pay

## 2017-12-15 ENCOUNTER — Ambulatory Visit (INDEPENDENT_AMBULATORY_CARE_PROVIDER_SITE_OTHER): Payer: No Typology Code available for payment source | Admitting: Obstetrics and Gynecology

## 2017-12-15 VITALS — BP 134/82 | HR 70 | Ht 63.25 in | Wt 143.0 lb

## 2017-12-15 DIAGNOSIS — R300 Dysuria: Secondary | ICD-10-CM | POA: Diagnosis not present

## 2017-12-15 DIAGNOSIS — N760 Acute vaginitis: Secondary | ICD-10-CM

## 2017-12-15 DIAGNOSIS — N926 Irregular menstruation, unspecified: Secondary | ICD-10-CM

## 2017-12-15 DIAGNOSIS — H538 Other visual disturbances: Secondary | ICD-10-CM | POA: Diagnosis not present

## 2017-12-15 DIAGNOSIS — H6983 Other specified disorders of Eustachian tube, bilateral: Secondary | ICD-10-CM | POA: Diagnosis not present

## 2017-12-15 DIAGNOSIS — R8290 Unspecified abnormal findings in urine: Secondary | ICD-10-CM | POA: Diagnosis not present

## 2017-12-15 DIAGNOSIS — H9313 Tinnitus, bilateral: Secondary | ICD-10-CM | POA: Diagnosis not present

## 2017-12-15 LAB — POCT URINALYSIS DIPSTICK
Bilirubin, UA: NEGATIVE
Blood, UA: NEGATIVE
GLUCOSE UA: NEGATIVE
Ketones, UA: NEGATIVE
Nitrite, UA: NEGATIVE
Protein, UA: NEGATIVE
Urobilinogen, UA: 0.2 E.U./dL
pH, UA: 5 (ref 5.0–8.0)

## 2017-12-15 LAB — POCT URINE PREGNANCY: PREG TEST UR: NEGATIVE

## 2017-12-15 MED ORDER — PREDNISONE 20 MG PO TABS: ORAL_TABLET | ORAL | 0 refills | Status: DC

## 2017-12-15 MED ORDER — NORETHINDRONE 0.35 MG PO TABS: 1.0000 | ORAL_TABLET | Freq: Every day | ORAL | 3 refills | Status: DC

## 2017-12-15 MED ORDER — CETIRIZINE HCL 10 MG PO TABS: 10.0000 mg | ORAL_TABLET | Freq: Every day | ORAL | 0 refills | Status: DC

## 2017-12-15 NOTE — Patient Instructions (Signed)
Norethindrone tablets (contraception) What is this medicine? NORETHINDRONE (nor eth IN drone) is an oral contraceptive. The product contains a female hormone known as a progestin. It is used to prevent pregnancy. This medicine may be used for other purposes; ask your health care provider or pharmacist if you have questions. COMMON BRAND NAME(S): Camila, Deblitane 28-Day, Errin, Heather, [place], Jolivette, [place], Nor-QD, Nora-BE, Norlyroc, Ortho Micronor, [employer] 28-Day What should I tell my health care provider before I take this medicine? They need to know if you have any of these conditions: -blood vessel disease or blood clots -breast, cervical, or vaginal cancer -diabetes -heart disease -kidney disease -liver disease -mental depression -migraine -seizures -stroke -vaginal bleeding -an unusual or allergic reaction to norethindrone, other medicines, foods, dyes, or preservatives -pregnant or trying to get pregnant -breast-feeding How should I use this medicine? Take this medicine by mouth with a glass of water. You may take it with or without food. Follow the directions on the prescription label. Take this medicine at the same time each day and in the order directed on the package. Do not take your medicine more often than directed. Contact your pediatrician regarding the use of this medicine in children. Special care may be needed. This medicine has been used in female children who have started having menstrual periods. A patient package insert for the product will be given with each prescription and refill. Read this sheet carefully each time. The sheet may change frequently. Overdosage: If you think you have taken too much of this medicine contact a poison control center or emergency room at once. NOTE: This medicine is only for you. Do not share this medicine with others. What if I miss a dose? Try not to miss a dose. Every time you miss a dose or take a dose late your chance of  pregnancy increases. When 1 pill is missed (even if only 3 hours late), take the missed pill as soon as possible and continue taking a pill each day at the regular time (use a back up method of birth control for the next 48 hours). If more than 1 dose is missed, use an additional birth control method for the rest of your pill pack until menses occurs. Contact your health care professional if more than 1 dose has been missed. What may interact with this medicine? Do not take this medicine with any of the following medications: -amprenavir or fosamprenavir -bosentan This medicine may also interact with the following medications: -antibiotics or medicines for infections, especially rifampin, rifabutin, rifapentine, and griseofulvin, and possibly penicillins or tetracyclines -aprepitant -barbiturate medicines, such as phenobarbital -carbamazepine -felbamate -modafinil -oxcarbazepine -phenytoin -ritonavir or other medicines for HIV infection or AIDS -St. John's wort -topiramate This list may not describe all possible interactions. Give your health care provider a list of all the medicines, herbs, non-prescription drugs, or dietary supplements you use. Also tell them if you smoke, drink alcohol, or use illegal drugs. Some items may interact with your medicine. What should I watch for while using this medicine? Visit your doctor or health care professional for regular checks on your progress. You will need a regular breast and pelvic exam and Pap smear while on this medicine. Use an additional method of birth control during the first cycle that you take these tablets. If you have any reason to think you are pregnant, stop taking this medicine right away and contact your doctor or health care professional. If you are taking this medicine for hormone related problems, it  may take several cycles of use to see improvement in your condition. This medicine does not protect you against HIV infection (AIDS)  or any other sexually transmitted diseases. What side effects may I notice from receiving this medicine? Side effects that you should report to your doctor or health care professional as soon as possible: -breast tenderness or discharge -pain in the abdomen, chest, groin or leg -severe headache -skin rash, itching, or hives -sudden shortness of breath -unusually weak or tired -vision or speech problems -yellowing of skin or eyes Side effects that usually do not require medical attention (report to your doctor or health care professional if they continue or are bothersome): -changes in sexual desire -change in menstrual flow -facial hair growth -fluid retention and swelling -headache -irritability -nausea -weight gain or loss This list may not describe all possible side effects. Call your doctor for medical advice about side effects. You may report side effects to FDA at 1-800-FDA-1088. Where should I keep my medicine? Keep out of the reach of children. Store at room temperature between 15 and 30 degrees C (59 and 86 degrees F). Throw away any unused medicine after the expiration date. NOTE: This sheet is a summary. It may not cover all possible information. If you have questions about this medicine, talk to your doctor, pharmacist, or health care provider.  2018 Elsevier/Gold Standard (2011-10-30 16:41:35)  

## 2017-12-15 NOTE — ED Provider Notes (Signed)
[facility]    CSN: [id number] Arrival date & time: 12/15/17  1824     History   Chief Complaint Chief Complaint  Patient presents with  . eye and ear issue    HPI Karsyn Jamie is a 32 y.o. female.   HPI Mardi Cannady is a 32 y.o. female presenting to UC with c/o 1-2 months of bilateral ear "thumping" in her ears and flashing lights in her eyes. Symptoms are intermittent but tend to be worse at night.  Hx of migraines but denies HA when these symptoms occur.  Denies fever, chills, n/v/d. No head injury.  She has not tried anything for her symptoms. She has a new patient consultation with a PCP on November 12th.  Pt requesting to have the sound in her ears to stop.  She does wear glasses but denies change in vision besides intermittent flashes.    Past Medical History:  Diagnosis Date  . Elevated cholesterol 2019  . Migraine    with aura    Patient Active Problem List   Diagnosis Date Noted  . Irregular menses 07/23/2017    History reviewed. No pertinent surgical history.  OB History    Gravida  1   Para  1   Term  0   Preterm  0   AB  0   Living  1     SAB  0   TAB  0   Ectopic  0   Multiple  0   Live Births  0            Home Medications    Prior to Admission medications   Medication Sig Start Date End Date Taking? Authorizing Provider  amoxicillin (AMOXIL) 500 MG capsule Take 1 capsule by mouth 3 (three) times daily as needed. For dental procedure 11/12/17   [provider]  cetirizine (ZYRTEC) 10 MG tablet Take 1 tablet (10 mg total) by mouth daily. 12/15/17   Noe Gens, PA-C  norethindrone (MICRONOR,CAMILA,ERRIN) 0.35 MG tablet Take 1 tablet (0.35 mg total) by mouth daily. 12/15/17   Nunzio Cobbs, MD  predniSONE (DELTASONE) 20 MG tablet 3 tabs po day one, then 2 po daily x 4 days 12/15/17   Noe Gens, PA-C    Family History Family History  Problem Relation Age of Onset  . Arthritis Father    . Polycystic ovary syndrome Sister   . Polycystic ovary syndrome Sister   . Goiter Sister   . Thyroid disease Sister     Social History Social History   Tobacco Use  . Smoking status: Passive Smoke Exposure - Never Smoker  . Smokeless tobacco: Never Used  Substance Use Topics  . Alcohol use: Not Currently  . Drug use: Never     Allergies   Other   Review of Systems Review of Systems  Constitutional: Negative for chills and fever.  HENT: Positive for tinnitus. Negative for congestion, ear pain and sore throat.   Eyes: Negative for photophobia and pain.  Gastrointestinal: Negative for nausea and vomiting.  Neurological: Negative for dizziness, light-headedness and headaches.     Physical Exam Triage Vital Signs ED Triage Vitals  Enc Vitals Group     BP 12/15/17 1847 (!) 146/91     Pulse Rate 12/15/17 1847 82     Resp --      Temp 12/15/17 1847 98 F (36.7 C)     Temp Source 12/15/17 1847 Oral  SpO2 12/15/17 1847 100 %     Weight 12/15/17 1848 141 lb (64 kg)     Height 12/15/17 1848 5\' 3"  (1.6 m)     Head Circumference --      Peak Flow --      Pain Score 12/15/17 1848 0     Pain Loc --      Pain Edu? --      Excl. in [place]? --    No data found.  Updated Vital Signs BP (!) 146/91 (BP Location: Right Arm)   Pulse 82   Temp 98 F (36.7 C) (Oral)   Ht 5\' 3"  (1.6 m)   Wt 141 lb (64 kg)   LMP 12/01/2017 (Approximate)   SpO2 100%   BMI 24.98 kg/m   Visual Acuity- with glasses Right Eye Distance: 20/13 Left Eye Distance: 20/13 Bilateral Distance: 20/13  Right Eye Near:   Left Eye Near:    Bilateral Near:     Physical Exam  Constitutional: She is oriented to person, place, and time. She appears well-developed and well-nourished. No distress.  HENT:  Head: Normocephalic and atraumatic.  Right Ear: Tympanic membrane is scarred.  Left Ear: Tympanic membrane is scarred.  Nose: Nose normal. Right sinus exhibits no maxillary sinus tenderness and no  frontal sinus tenderness. Left sinus exhibits no maxillary sinus tenderness and no frontal sinus tenderness.  Mouth/Throat: Uvula is midline, oropharynx is clear and moist and mucous membranes are normal.  Eyes: Pupils are equal, round, and reactive to light. Conjunctivae and EOM are normal. Right eye exhibits no discharge. Left eye exhibits no discharge.  Neck: Normal range of motion. Neck supple.  Cardiovascular: Normal rate.  Pulmonary/Chest: Effort normal. No respiratory distress.  Musculoskeletal: Normal range of motion.  Neurological: She is alert and oriented to person, place, and time.  Skin: Skin is warm and dry. She is not diaphoretic.  Psychiatric: She has a normal mood and affect. Her behavior is normal.  Nursing note and vitals reviewed.    UC Treatments / Results  Labs (all labs ordered are listed, but only abnormal results are displayed) Labs Reviewed - No data to display  EKG None  Radiology No results found.  Procedures Procedures (including critical care time)  Medications Ordered in UC Medications - No data to display  Initial Impression / Assessment and Plan / UC Course  I have reviewed the triage vital signs and the nursing notes.  Pertinent labs & imaging results that were available during my care of the patient were reviewed by me and considered in my medical decision making (see chart for details).     Tympanometry: Right ear- High peak height,  Left ear- low peak height, large ear volume.  Pt notes she is to start amoxicillin tomorrow for a tooth infection. Will also start pt on prednisone and cetirizine   Encouraged f/u with PCP and ENT   Final Clinical Impressions(s) / UC Diagnoses   Final diagnoses:  Tinnitus aurium, bilateral  Eustachian tube dysfunction, bilateral  Flashing lights seen     Discharge Instructions      Please follow up with family medicine in 2 weeks as previously scheduled.  Please follow up with ear nose throat  (ENT) in 1-2 weeks if not improving.     ED Prescriptions    Medication Sig Dispense Auth. Provider   predniSONE (DELTASONE) 20 MG tablet 3 tabs po day one, then 2 po daily x 4 days 11 tablet Noe Gens, [place]  cetirizine (ZYRTEC) 10 MG tablet Take 1 tablet (10 mg total) by mouth daily. 30 tablet Noe Gens, PA-C     Controlled Substance Prescriptions [place] Controlled Substance Registry consulted? Not Applicable   Tyrell Antonio 12/15/17 1927

## 2017-12-15 NOTE — ED Triage Notes (Signed)
Over the past month, pt has seen floaters in her eyes.  Also, when her eyes are closed, she sees flashes of light in her lower eyes. Over the last 2 months, she has heard a noise in her right ear, mostly at night that sounds like a pounding.

## 2017-12-15 NOTE — Discharge Instructions (Signed)
°  Please follow up with family medicine in 2 weeks as previously scheduled.  Please follow up with ear nose throat (ENT) in 1-2 weeks if not improving.

## 2017-12-15 NOTE — Progress Notes (Addendum)
GYNECOLOGY  VISIT   HPI: 32 y.o.   Married  Asian  female   [place] with Patient's last menstrual period was 12/01/2017 (approximate).   here for irregular menses.   LMP 12-01-17 spotting x 2 days PMP 10/2017 spotting x 2 days PMP 09/2017 spotting x 2 - 3 days PMP 08/2017 extremely heavy x 5-6 days.  Changing every 30 minutes.  Menses are occurring monthly.   Long history of irregular menses.  Labs in may showed elevated LH >>FSH.   Took birth control in the past and she did well.  Denies hx of HTN. Has hx migraine with aura. Denies history breast or liver disease.  Denies hx of DVT or PE or family history of this.  Not a smoker.   States she did an ovulation predictor kit months ago and is not ovulating.   Not trying for pregnancy at this time.   Having vaginal discharge.  No odor.  No itching.  Some burning.  No dysuria.   Will see a PCP on Nov. 12. She is having some inner ear noise at night.   She was having some visual changes with flashes. Wears glasses. Hx migraines.   She states her sisters have PCOS.   UPT: Neg  Urine Dip: 2+ WBCs  GYNECOLOGIC HISTORY: Patient's last menstrual period was 12/01/2017 (approximate). Contraception:  None Menopausal hormone therapy:  n/a Last mammogram: n/a Last pap smear: 08-04-17 Neg:Neg HR HPV        OB History    Gravida  1   Para  1   Term  0   Preterm  0   AB  0   Living  1     SAB  0   TAB  0   Ectopic  0   Multiple  0   Live Births  0              Patient Active Problem List   Diagnosis Date Noted  . Irregular menses 07/23/2017    Past Medical History:  Diagnosis Date  . Elevated cholesterol 2019  . Migraine    with aura    No past surgical history on file.  Current Outpatient Medications  Medication Sig Dispense Refill  . amoxicillin (AMOXIL) 500 MG capsule Take 1 capsule by mouth 3 (three) times daily as needed. For dental procedure  0   No current facility-administered  medications for this visit.      ALLERGIES: Other  Family History  Problem Relation Age of Onset  . Arthritis Father   . Polycystic ovary syndrome Sister   . Polycystic ovary syndrome Sister   . Goiter Sister   . Thyroid disease Sister     Social History   Socioeconomic History  . Marital status: Married    Spouse name: Not on file  . Number of children: Not on file  . Years of education: Not on file  . Highest education level: Not on file  Occupational History  . Not on file  Social Needs  . Financial resource strain: Not on file  . Food insecurity:    Worry: Not on file    Inability: Not on file  . Transportation needs:    Medical: Not on file    Non-medical: Not on file  Tobacco Use  . Smoking status: Passive Smoke Exposure - Never Smoker  . Smokeless tobacco: Never Used  Substance and Sexual Activity  . Alcohol use: Not Currently  . Drug use: Never  .  Sexual activity: Yes    Birth control/protection: None  Lifestyle  . Physical activity:    Days per week: Not on file    Minutes per session: Not on file  . Stress: Not on file  Relationships  . Social connections:    Talks on phone: Not on file    Gets together: Not on file    Attends religious service: Not on file    Active member of club or organization: Not on file    Attends meetings of clubs or organizations: Not on file    Relationship status: Not on file  . Intimate partner violence:    Fear of current or ex partner: Not on file    Emotionally abused: Not on file    Physically abused: Not on file    Forced sexual activity: Not on file  Other Topics Concern  . Not on file  Social History Narrative  . Not on file    Review of Systems  HENT: Positive for tinnitus.   Eyes: Positive for visual disturbance (seeing floaters--spots).  Genitourinary: Positive for dysuria.  All other systems reviewed and are negative.   PHYSICAL EXAMINATION:    BP 134/82 (BP Location: Right Arm, Patient  Position: Sitting, Cuff Size: Normal)   Pulse 70   Ht 5' 3.25" (1.607 m)   Wt 143 lb (64.9 kg)   LMP 12/01/2017 (Approximate)   BMI 25.13 kg/m     General appearance: alert, cooperative and appears stated age   Pelvic: External genitalia:  no lesions              Urethra:  normal appearing urethra with no masses, tenderness or lesions              Bartholins and Skenes: normal                 Vagina: normal appearing vagina with normal color and discharge, no lesions              Cervix: no lesions                Bimanual Exam:  Uterus:  normal size, contour, position, consistency, mobility, non-tender              Adnexa: no mass, fullness, tenderness             Chaperone was present for exam.  ASSESSMENT  Change in menstruation.  Probable PCOS. Hx migraine with aura.  Vaginitis.  Abnormal urine dip.  Visual change and auditory change.  PLAN  We discussed her menstrual pattern and probable PCOS and potential effects on health.  As she is not trying for pregnancy now, will start Micronor with next menses.  Instructed in use.  We reviewed Clomid and Letrozole for ovulation induction when she is ready to try for pregnancy.  Affirm testing.  Urine micro and culture.  She will follow up with her ophthalmologist and her PCP regarding her vision and hearing concerns.  FU here in 3 months.   An After Visit Summary was printed and given to the patient.  __25____ minutes face to face time of which over 50% was spent in counseling.

## 2017-12-16 LAB — URINE CULTURE

## 2017-12-16 LAB — URINALYSIS, MICROSCOPIC ONLY: CASTS: NONE SEEN /LPF

## 2017-12-17 ENCOUNTER — Telehealth: Payer: Self-pay | Admitting: *Deleted

## 2017-12-17 LAB — VAGINITIS/VAGINOSIS, DNA PROBE
CANDIDA SPECIES: NEGATIVE
GARDNERELLA VAGINALIS: POSITIVE — AB
Trichomonas vaginosis: NEGATIVE

## 2017-12-17 MED ORDER — METRONIDAZOLE 500 MG PO TABS: 500.0000 mg | ORAL_TABLET | Freq: Two times a day (BID) | ORAL | 0 refills | Status: DC

## 2017-12-17 NOTE — Telephone Encounter (Signed)
-----   Message from Nunzio Cobbs, MD sent at 12/17/2017 12:11 PM EDT ----- Please inform of Affirm result showing bacterial vaginosis. She may treat with Flagyl 500 mg po bid for 7 days or Metrogel pv at hs for 5 nights.  Please send Rx to pharmacy of choice. ETOH precautions.   Her urine culture was negative for bladder infection.

## 2017-12-17 NOTE — Telephone Encounter (Signed)
Notes recorded by Burnice Logan, RN on 12/17/2017 at 2:39 PM EDT Left message to call Sharee Pimple, RN at [facility].

## 2017-12-17 NOTE — Telephone Encounter (Signed)
Spoke with patient, advised as seen below per Dr. Quincy Simmonds. Rx for flagyl to verified pharmacy. Patient verbalizes understanding and is agreeable.   Encounter closed.

## 2017-12-21 ENCOUNTER — Telehealth: Payer: Self-pay | Admitting: Obstetrics and Gynecology

## 2017-12-21 NOTE — Telephone Encounter (Signed)
Patient is calling with questions regarding medications.

## 2017-12-21 NOTE — Telephone Encounter (Signed)
Spoke with patient. Starting Flagyl today for BV, completed prednisone RX 10/28.   1. Patient asking if any interactions between flagyl and cetirizine? Advised Per UpToDate, no interactions.   2. Patient will start amoxil on 11/11, 3 days prior to dental procedure, asking if ok to start flagyl? Advised should be completed with flagyl prior to starting Amoxil. Recommended patient to f/u with dentist to advise on amoxil.   Routing to provider for final review. Patient is agreeable to disposition. Will close encounter.

## 2017-12-24 ENCOUNTER — Telehealth: Payer: Self-pay | Admitting: *Deleted

## 2017-12-24 MED ORDER — MECLIZINE HCL 12.5 MG PO TABS: 12.5000 mg | ORAL_TABLET | Freq: Three times a day (TID) | ORAL | 0 refills | Status: DC | PRN

## 2017-12-24 NOTE — Telephone Encounter (Signed)
Pt called reports that she still has tinnitus. Denies pain. She has an appt with ENT on 12/27/17. She denies having any relief with prednisone. Per phelps,PA we can send in meclizine. Keep appt with ENT even if she is feeling better. Pt verbalized understanding.

## 2018-01-03 ENCOUNTER — Ambulatory Visit: Payer: No Typology Code available for payment source | Admitting: Osteopathic Medicine

## 2018-01-13 ENCOUNTER — Encounter: Payer: Self-pay | Admitting: Osteopathic Medicine

## 2018-01-13 ENCOUNTER — Ambulatory Visit (INDEPENDENT_AMBULATORY_CARE_PROVIDER_SITE_OTHER): Payer: No Typology Code available for payment source | Admitting: Osteopathic Medicine

## 2018-01-13 VITALS — BP 134/68 | HR 70 | Temp 98.2°F | Ht 63.0 in | Wt 142.1 lb

## 2018-01-13 DIAGNOSIS — H9319 Tinnitus, unspecified ear: Secondary | ICD-10-CM

## 2018-01-13 DIAGNOSIS — Z8669 Personal history of other diseases of the nervous system and sense organs: Secondary | ICD-10-CM | POA: Diagnosis not present

## 2018-01-13 DIAGNOSIS — E782 Mixed hyperlipidemia: Secondary | ICD-10-CM

## 2018-01-13 DIAGNOSIS — E282 Polycystic ovarian syndrome: Secondary | ICD-10-CM

## 2018-01-13 DIAGNOSIS — H43393 Other vitreous opacities, bilateral: Secondary | ICD-10-CM

## 2018-01-13 NOTE — Progress Notes (Signed)
HPI: Julia Carter is a 32 y.o. female who  has a past medical history of Elevated cholesterol (2019) and Migraine.  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  She reports ringing/humming in the ears, bilaterally.  No diminished hearing.  Occasional migraines but no other unusual headaches.  Reports occasional floaters in her vision, no blacking out of vision, she is following with an eye doctor annually, she has a checkup coming up in January.  Reports occasional anxiety/irritability but nothing that really causes her issues in her relationships or her work life.  No FH of colon cancer, breast cancer, ovarian cancer.  History of high cholesterol, labs checked earlier this summer, total cholesterol 208, LDL 141, TG 131, HDL 41.  Never smoker, no drug use.  Infrequent/rare alcohol use.  Monogamous with current partner.  Declines STI testing.  G1, P1 -no complications during pregnancy.  She is following regularly with OB/GYN, last visit was earlier this year.  They have been taking care of cholesterol screening and other basic primary care/preventive care.  History of PCOS, patient was recently started on OCP but has not been taking these yet.     Past medical, surgical, social and family history reviewed:  Patient Active Problem List   Diagnosis Date Noted  . Irregular menses 07/23/2017    No past surgical history on file.  Social History   Tobacco Use  . Smoking status: Passive Smoke Exposure - Never Smoker  . Smokeless tobacco: Never Used  Substance Use Topics  . Alcohol use: Not Currently    Family History  Problem Relation Age of Onset  . Arthritis Father   . Polycystic  ovary syndrome Sister   . Polycystic ovary syndrome Sister   . Goiter Sister   . Thyroid disease Sister      Current medication list and allergy/intolerance information reviewed:    Current Outpatient Medications  Medication Sig Dispense Refill  . amoxicillin (AMOXIL) 500 MG capsule Take 1 capsule by mouth 3 (three) times daily as needed. For dental procedure  0  . norethindrone (MICRONOR,CAMILA,ERRIN) 0.35 MG tablet Take 1 tablet (0.35 mg total) by mouth daily. (Patient not taking: Reported on 01/13/2018) 1 Package 3   No current facility-administered medications for this visit.     Allergies  Allergen Reactions  . Other     Some fresh fruits      Review of Systems:  Constitutional:  No  fever, no chills, No recent illness, +unintentional weight gain. No significant fatigue.   HEENT: No  headache, +vision change, +hearing change, No sore throat, No  sinus pressure  Cardiac: No  chest pain, No  pressure, No palpitations, No  Orthopnea  Respiratory:  No  shortness of breath. No  Cough  Gastrointestinal: No  abdominal pain, No  nausea, No  vomiting,  No  blood in stool, No  diarrhea, No  Constipation, +increased appetite  Musculoskeletal: No new myalgia/arthralgia  Skin: No  Rash, No other wounds/concerning lesions  Genitourinary: No  incontinence, No  abnormal genital bleeding, No abnormal genital discharge, +abnormal periods  Hem/Onc: No  easy bruising/bleeding, No  abnormal lymph node  Endocrine: No cold intolerance,  No heat intolerance. No polyuria/polydipsia/polyphagia  Neurologic: No  weakness, No  dizziness, No  slurred speech/focal weakness/facial droop  Psychiatric: No  concerns with depression, +concerns with anxiety, No sleep problems, No mood problems  Exam:  BP 134/68 (BP Location: Left Arm, Patient Position: Sitting, Cuff Size: Normal)   Pulse 70   Temp 98.2 F (36.8 C) (Oral)   Ht 5\' 3"  (1.6 m)   Wt 142 lb 1.6 oz (64.5 kg)   BMI 25.17 kg/m    Constitutional: VS see above. General Appearance: alert, well-developed, well-nourished, NAD  Eyes: Normal lids and conjunctive, non-icteric sclera  Ears, Nose, Mouth, Throat: MMM, Normal external inspection ears/nares/mouth/lips/gums. TM normal bilaterally. Pharynx/tonsils no erythema, no exudate. Nasal mucosa normal.   Neck: No masses, trachea midline. No thyroid enlargement. No tenderness/mass appreciated. No lymphadenopathy  Respiratory: Normal respiratory effort. no wheeze, no rhonchi, no rales  Cardiovascular: S1/S2 normal, no murmur, no rub/gallop auscultated. RRR. No lower extremity edema.  Gastrointestinal: Nontender, no masses. No hepatomegaly, no splenomegaly. No hernia appreciated. Bowel sounds normal. Rectal exam deferred.   Musculoskeletal: Gait normal. No clubbing/cyanosis of digits.   Neurological: Normal balance/coordination. No tremor. No cranial nerve deficit on limited exam. Motor and sensation intact and symmetric. Cerebellar reflexes intact.   Skin: warm, dry, intact. No rash/ulcer. No concerning nevi or subq nodules on limited exam.    Psychiatric: Normal judgment/insight. Normal mood and affect. Oriented x3.              ASSESSMENT/PLAN: The primary encounter diagnosis was Tinnitus, unspecified laterality. Diagnoses of History of migraine, Mixed hyperlipidemia, Vitreous floaters of both eyes, and PCOS (polycystic ovarian syndrome) were also pertinent to this visit.   Orders Placed This Encounter  Procedures  . Ambulatory referral to ENT        Visit summary with medication list and pertinent instructions was printed for patient to review. All questions at time of visit were answered - patient instructed to contact office with any additional concerns or updates. ER/RTC precautions were reviewed with the patient.   Note: Total time spent 30 minutes, greater than 50% of the visit was spent face-to-face counseling and coordinating care for the  above diagnoses listed in assessment/plan.   Please note: voice recognition software was used to produce this document, and typos may escape review. Please contact Dr. Sheppard Coil for any needed clarifications.       Follow-up plan: Return in about 6 months (around 07/14/2018) for [place] - sooner if needed .

## 2018-01-17 ENCOUNTER — Encounter: Payer: Self-pay | Admitting: Obstetrics and Gynecology

## 2018-01-18 ENCOUNTER — Telehealth: Payer: Self-pay | Admitting: Obstetrics and Gynecology

## 2018-01-18 NOTE — Telephone Encounter (Signed)
Spoke with patient.  Requested instructions for starting her Micronor.  Hx irregular cycles.  LMP 01/15/18.  Advised patient can start pill today after taking home pregnancy test to ensure no chance for pregnancy.  Instructions reviewed for taking progesterone only pills.  A back up method should be used in the first 7 days of pills. Pills should be taken at the same time each day-very important!  If a pill is taken 3 or more hours after the normal ingestion time, a back up method should be used for two days. If two or more pills are missed the next pill should be taken as soon as possible and a back up method should be used for 7 days.   Pt verbalized understanding of instructions.  Will follow up as scheduled for follow up appointment with Dr. Quincy Simmonds 03/17/2018.   Okay to close?

## 2018-01-18 NOTE — Telephone Encounter (Signed)
Encounter reviewed and closed.  

## 2018-01-18 NOTE — Telephone Encounter (Signed)
Patient sent the following correspondence through [place]. Routing to triage to assist patient with request.  Good eve Doctor Quincy Simmonds. I have brown spot period since last Saturday. Yesterday it was red and then today its brown again. I have not started my birth control yet.  Last seen: 12/15/17

## 2018-01-19 ENCOUNTER — Other Ambulatory Visit: Payer: Self-pay

## 2018-01-19 ENCOUNTER — Emergency Department (INDEPENDENT_AMBULATORY_CARE_PROVIDER_SITE_OTHER): Payer: No Typology Code available for payment source

## 2018-01-19 ENCOUNTER — Encounter: Payer: Self-pay | Admitting: *Deleted

## 2018-01-19 ENCOUNTER — Emergency Department
Admission: EM | Admit: 2018-01-19 | Discharge: 2018-01-19 | Disposition: A | Discharge status: Home / Self Care | Payer: No Typology Code available for payment source | Source: Home / Self Care | Attending: Family Medicine | Admitting: Family Medicine

## 2018-01-19 DIAGNOSIS — R079 Chest pain, unspecified: Secondary | ICD-10-CM

## 2018-01-19 DIAGNOSIS — M940 Chondrocostal junction syndrome [Tietze]: Secondary | ICD-10-CM | POA: Diagnosis not present

## 2018-01-19 NOTE — ED Triage Notes (Signed)
Pt c/o 2 episodes of pain in her breast bilaterally one last week and one today. They lasted for 20 minutes each and resolved. She denies SOB or recent URI. The pain is sharp.

## 2018-01-19 NOTE — ED Provider Notes (Signed)
[facility]    CSN: [id number] Arrival date & time: 01/19/18  1141     History   Chief Complaint Chief Complaint  Patient presents with  . Chest Pain    HPI Julia Carter is a 32 y.o. female.   One week ago while walking up stairs patient developed non-radiating pain over her sternum that lasted about 20 minutes.  She denies nausea/vomiting or shortness of breath.  She recalls no injury or change in activities, and no recent URI.  The pain resolved spontaneously.  The pain recurred today while she was working at her computer, and lasted about 20 minutes.  The history is provided by the patient.  Chest Pain  Pain location:  Substernal area Pain quality: stabbing   Pain radiates to:  Does not radiate Pain severity:  Moderate Onset quality:  Sudden Duration:  20 minutes Timing:  Constant Progression:  Resolved Chronicity:  Recurrent Context: at rest   Context: not breathing, not eating, not lifting, not movement, not raising an arm, not stress and not trauma   Relieved by:  None tried Worsened by:  Nothing Ineffective treatments: oil of peppermint. Associated symptoms: no abdominal pain, no AICD problem, no anorexia, no back pain, no cough, no diaphoresis, no dizziness, no dysphagia, no fatigue, no fever, no heartburn, no lower extremity edema, no nausea, no near-syncope, no palpitations, no shortness of breath and no vomiting     Past Medical History:  Diagnosis Date  . Elevated cholesterol 2019  . Migraine    with aura    Patient Active Problem List   Diagnosis Date Noted  . Irregular menses 07/23/2017    History reviewed. No pertinent surgical history.  OB History    Gravida  1   Para  1   Term  0   Preterm  0   AB  0   Living  1     SAB  0   TAB  0   Ectopic  0   Multiple  0   Live Births  0            Home Medications    Prior to Admission medications   Medication Sig Start Date End Date Taking? Authorizing  Provider  norethindrone (MICRONOR,CAMILA,ERRIN) 0.35 MG tablet Take 1 tablet (0.35 mg total) by mouth daily. Patient not taking: Reported on 01/13/2018 12/15/17   Nunzio Cobbs, MD    Family History Family History  Problem Relation Age of Onset  . Arthritis Father   . Polycystic ovary syndrome Sister   . Polycystic ovary syndrome Sister   . Goiter Sister   . Thyroid disease Sister     Social History Social History   Tobacco Use  . Smoking status: Passive Smoke Exposure - Never Smoker  . Smokeless tobacco: Never Used  Substance Use Topics  . Alcohol use: Not Currently  . Drug use: Never     Allergies   Other   Review of Systems Review of Systems  Constitutional: Negative for diaphoresis, fatigue and fever.  HENT: Negative for trouble swallowing.   Respiratory: Negative for cough and shortness of breath.   Cardiovascular: Positive for chest pain. Negative for palpitations and near-syncope.  Gastrointestinal: Negative for abdominal pain, anorexia, heartburn, nausea and vomiting.  Musculoskeletal: Negative for back pain.  Neurological: Negative for dizziness.  All other systems reviewed and are negative.    Physical Exam Triage Vital Signs ED Triage Vitals  Enc Vitals Group  BP 01/19/18 1200 (!) 152/87     Pulse Rate 01/19/18 1200 85     Resp 01/19/18 1200 18     Temp 01/19/18 1200 97.8 F (36.6 C)     Temp Source 01/19/18 1200 Oral     SpO2 01/19/18 1200 100 %     Weight 01/19/18 1201 144 lb (65.3 kg)     Height 01/19/18 1201 5\' 3"  (1.6 m)     Head Circumference --      Peak Flow --      Pain Score 01/19/18 1201 0     Pain Loc --      Pain Edu? --      Excl. in [place]? --    No data found.  Updated Vital Signs BP (!) 152/87 (BP Location: Right Arm)   Pulse 85   Temp 97.8 F (36.6 C) (Oral)   Resp 18   Ht 5\' 3"  (1.6 m)   Wt 65.3 kg   LMP 01/14/2018   SpO2 100%   BMI 25.51 kg/m   Visual Acuity Right Eye Distance:   Left Eye  Distance:   Bilateral Distance:    Right Eye Near:   Left Eye Near:    Bilateral Near:     Physical Exam  Constitutional: She appears well-developed and well-nourished. No distress.  HENT:  Head: Normocephalic.  Right Ear: External ear normal.  Left Ear: External ear normal.  Nose: Nose normal.  Mouth/Throat: Oropharynx is clear and moist.  Eyes: Pupils are equal, round, and reactive to light. Conjunctivae are normal.  Neck: Neck supple.  Cardiovascular: Normal heart sounds.  Pulmonary/Chest: Breath sounds normal. No respiratory distress. She has no wheezes. She has no rales. She exhibits tenderness and bony tenderness.  There is distinct tenderness to palpation over the mid-sternum.  Palpation there recreates patient's pain.    Abdominal: Soft. There is no tenderness.  Musculoskeletal: She exhibits no edema or tenderness.  Lymphadenopathy:    She has no cervical adenopathy.  Neurological: She is alert.  Skin: Skin is warm and dry. She is not diaphoretic.  Nursing note and vitals reviewed.    UC Treatments / Results  Labs (all labs ordered are listed, but only abnormal results are displayed) Labs Reviewed - No data to display  EKG None  Radiology Dg Chest 2 View  Result Date: 01/19/2018 CLINICAL DATA:  Chest pain. EXAM: CHEST - 2 VIEW COMPARISON:  None. FINDINGS: The heart size and mediastinal contours are within normal limits. Both lungs are clear. The visualized skeletal structures are unremarkable. IMPRESSION: Normal exam. Electronically Signed   By: Lorriane Shire M.D.   On: 01/19/2018 12:29    Procedures Procedures (including critical care time)  Medications Ordered in UC Medications - No data to display  Initial Impression / Assessment and Plan / UC Course  I have reviewed the triage vital signs and the nursing notes.  Pertinent labs & imaging results that were available during my care of the patient were reviewed by me and considered in my medical  decision making (see chart for details).    Patient reassured. Followup with Dr. Aundria Mems or Dr. Lynne Leader ([facility]) if not improving about two weeks.   Final Clinical Impressions(s) / UC Diagnoses   Final diagnoses:  Costochondritis     Discharge Instructions      Put ice on the painful area: ? Put ice in a plastic bag. ? Place a towel between your skin and the bag. ?  Leave the ice on for 20 minutes, 2-3 times a day.  May take Ibuprofen 200mg , 4 tabs every 8 hours with food, OR may take two Aleve tabs every 12 hours.    ED Prescriptions    None         Kandra Nicolas, MD 01/19/18 1252

## 2018-01-19 NOTE — Discharge Instructions (Addendum)
Put ice on the painful area: Put ice in a plastic bag. Place a towel between your skin and the bag. Leave the ice on for 20 minutes, 2-3 times a day. May take Ibuprofen 200mg , 4 tabs every 8 hours with food, OR may take two Aleve tabs every 12 hours.

## 2018-02-21 ENCOUNTER — Telehealth: Payer: Self-pay | Admitting: Obstetrics and Gynecology

## 2018-02-21 ENCOUNTER — Encounter: Payer: Self-pay | Admitting: Obstetrics and Gynecology

## 2018-02-21 NOTE — Telephone Encounter (Signed)
I recommend she wait to start her pills when her next spotting occurs.   I expect she will start this soon as her last cycle was about one month ago.  She reported to me that her cycles are quite regular but just very light and last for a couple of days.  Have her do a pregnancy test with the next spotting and then start the pills if her pregnancy test is negative.

## 2018-02-21 NOTE — Telephone Encounter (Signed)
Patient sent the following message through [place]. Routing to triage to assist patient with request.   Good morning. I have not started my birth control yet because I have been taking a lot of medication. Can I take it this coming Sunday?

## 2018-02-21 NOTE — Telephone Encounter (Signed)
Spoke with patient, advised as seen below per Dr. Silva.  Patient verbalizes understanding and is agreeable.  Encounter closed.  

## 2018-02-21 NOTE — Telephone Encounter (Signed)
Spoke with patient. Has not started POP. Hx of irregular menses. LMP "end of November, unsure of exact date". SA. No current contraceptive.   Advised to start POP with next menses. Patient states she usually only has spotting, no full cycle. Asking if OK to take UPT and start POP as previously advised? Advised I will review with Dr. Quincy Simmonds and return call.   OV for 58mo recheck rescheduled to 05/25/18 at 3:30pm.  Dr. Quincy Simmonds -please advise on POP start.

## 2018-03-10 ENCOUNTER — Encounter: Payer: Self-pay | Admitting: Obstetrics and Gynecology

## 2018-03-11 ENCOUNTER — Encounter: Payer: Self-pay | Admitting: Obstetrics and Gynecology

## 2018-03-11 ENCOUNTER — Ambulatory Visit: Payer: No Typology Code available for payment source | Admitting: Obstetrics and Gynecology

## 2018-03-11 ENCOUNTER — Other Ambulatory Visit: Payer: Self-pay

## 2018-03-11 ENCOUNTER — Telehealth: Payer: Self-pay | Admitting: Obstetrics and Gynecology

## 2018-03-11 VITALS — BP 128/62 | HR 76 | Ht 63.25 in | Wt 149.4 lb

## 2018-03-11 DIAGNOSIS — Z3169 Encounter for other general counseling and advice on procreation: Secondary | ICD-10-CM | POA: Diagnosis not present

## 2018-03-11 DIAGNOSIS — R109 Unspecified abdominal pain: Secondary | ICD-10-CM | POA: Diagnosis not present

## 2018-03-11 DIAGNOSIS — N926 Irregular menstruation, unspecified: Secondary | ICD-10-CM | POA: Diagnosis not present

## 2018-03-11 LAB — POCT URINE PREGNANCY: Preg Test, Ur: NEGATIVE

## 2018-03-11 MED ORDER — IBUPROFEN 800 MG PO TABS: 800.0000 mg | ORAL_TABLET | Freq: Three times a day (TID) | ORAL | 0 refills | Status: DC | PRN

## 2018-03-11 NOTE — Progress Notes (Signed)
GYNECOLOGY  VISIT   HPI: 33 y.o.   Married  Asian  female   [place] with Patient's last menstrual period was 03/03/2018 (exact date).   here for leg cramps on Micronor. Patient states she was having leg cramps in upper thighs and having abdominal cramping x 5 days. She just began taking Micronor this month. She stopped it last night.  She has very light spotting type menses, and her last menses was typical.   Cramping is now improved off the Micronor.   Sitting a lot at work.   She and her husband are thinking about trying for pregnancy.  UPT negative at home one week ago.  She did an ovulation predictor kit which showed she is not ovulating.Marland Kitchen   UPT negative.   GYNECOLOGIC HISTORY: Patient's last menstrual period was 03/03/2018 (exact date). Contraception:  Micronor Menopausal hormone therapy:  none Last mammogram:  n/a Last pap smear: 08-04-17 Neg:Neg HR HPV        OB History    Gravida  1   Para  1   Term  0   Preterm  0   AB  0   Living  1     SAB  0   TAB  0   Ectopic  0   Multiple  0   Live Births  0              Patient Active Problem List   Diagnosis Date Noted  . Irregular menses 07/23/2017    Past Medical History:  Diagnosis Date  . Elevated cholesterol 2019  . Migraine    with aura    No past surgical history on file.  Current Outpatient Medications  Medication Sig Dispense Refill  . norethindrone (MICRONOR,CAMILA,ERRIN) 0.35 MG tablet Take 1 tablet (0.35 mg total) by mouth daily. (Patient not taking: Reported on 01/13/2018) 1 Package 3   No current facility-administered medications for this visit.      ALLERGIES: Other  Family History  Problem Relation Age of Onset  . Arthritis Father   . Polycystic ovary syndrome Sister   . Polycystic ovary syndrome Sister   . Goiter Sister   . Thyroid disease Sister     Social History   Socioeconomic History  . Marital status: Married    Spouse name: Not on file  . Number of  children: 1  . Years of education: Not on file  . Highest education level: Not on file  Occupational History  . Occupation: A[employer] Loss adjuster, chartered: [employer]  Social Needs  . Financial resource strain: Not on file  . Food insecurity:    Worry: Not on file    Inability: Not on file  . Transportation needs:    Medical: Not on file    Non-medical: Not on file  Tobacco Use  . Smoking status: Passive Smoke Exposure - Never Smoker  . Smokeless tobacco: Never Used  Substance and Sexual Activity  . Alcohol use: Not Currently  . Drug use: Never  . Sexual activity: Yes    Partners: Male    Birth control/protection: None  Lifestyle  . Physical activity:    Days per week: Not on file    Minutes per session: Not on file  . Stress: Not on file  Relationships  . Social connections:    Talks on phone: Not on file    Gets together: Not on file    Attends religious service: Not on file    Active  member of club or organization: Not on file    Attends meetings of clubs or organizations: Not on file    Relationship status: Not on file  . Intimate partner violence:    Fear of current or ex partner: Not on file    Emotionally abused: Not on file    Physically abused: Not on file    Forced sexual activity: Not on file  Other Topics Concern  . Not on file  Social History Narrative  . Not on file    Review of Systems  Gastrointestinal:       Bloating  Musculoskeletal: Positive for myalgias.  All other systems reviewed and are negative.   PHYSICAL EXAMINATION:    BP 128/62 (BP Location: Right Arm, Patient Position: Sitting, Cuff Size: Normal)   Pulse 76   Ht 5' 3.25" (1.607 m)   Wt 149 lb 6.4 oz (67.8 kg)   LMP 03/03/2018 (Exact Date)   BMI 26.26 kg/m     General appearance: alert, cooperative and appears stated age   Pelvic: External genitalia:  no lesions              Urethra:  normal appearing urethra with no masses, tenderness or lesions              Bartholins and  Skenes: normal                 Vagina: normal appearing vagina with normal color and discharge, no lesions              Cervix: no lesions.  Blood and clot in vagina.                 Bimanual Exam:  Uterus:  normal size, contour, position, consistency, mobility, non-tender              Adnexa: no mass, fullness, tenderness       Chaperone was present for exam.  ASSESSMENT  Abdominal bloating and cramping.  Neg UPT.  Desire for future pregnancy.  Discontinuation of Micronor.  Irregular cycles, light menstruation.  PCOS hx.   PLAN  Preconception counseling given.  Motrin 800 mg every 8 hours prn.  Start PNV.  Return for progesterone serum level on 03/31/18. May need ovulation induction.  Had flu vaccine.  Check Rubella level.   An After Visit Summary was printed and given to the patient.  __25____ minutes face to face time of which over 50% was spent in counseling.

## 2018-03-11 NOTE — Telephone Encounter (Signed)
I am happy to see the patient this afternoon.  I see that she has an appointment time with me.

## 2018-03-11 NOTE — Telephone Encounter (Signed)
Patient has a question about her birth control. °

## 2018-03-11 NOTE — Telephone Encounter (Addendum)
Spoke with patient.  She also sent a mychart message.  She stopped her Micronor last night. She states she has developed bilateral leg pain and leg cramps and feels leg pain started with use of Micronor.  No redness or swelling.  No difficultly walking.  She states she talked with her pharmacist and was advised leg cramps can be a side effect of progesterone only pills.  Patient would like to stop pills.  Agrees to office visit, however, does not accept appointment until afternoon due to her work schedule.  Denies SOB, chest pain, or any other concerns, states she feels well other than the cramps in her legs.  Office visit today with Dr. Quincy Simmonds or ED if symptoms worsen. Pt agreeable.  Encounter to Dr. Quincy Simmonds.

## 2018-03-12 LAB — RUBELLA SCREEN: RUBELLA: 15.9 {index} (ref >0.99)

## 2018-03-12 LAB — PROGESTERONE: Progesterone: 0.1 ng/mL

## 2018-03-17 ENCOUNTER — Ambulatory Visit: Payer: No Typology Code available for payment source | Admitting: Obstetrics and Gynecology

## 2018-03-23 ENCOUNTER — Telehealth: Payer: Self-pay | Admitting: Obstetrics and Gynecology

## 2018-03-23 NOTE — Telephone Encounter (Signed)
Patient called and canceled lab appointment for 03/31/18.  Cc: Dr. Quincy Simmonds for [facility]

## 2018-03-23 NOTE — Telephone Encounter (Signed)
I have reviewed this call and closed the encounter.

## 2018-03-31 ENCOUNTER — Other Ambulatory Visit: Payer: No Typology Code available for payment source

## 2018-04-03 ENCOUNTER — Encounter: Payer: Self-pay | Admitting: Obstetrics and Gynecology

## 2018-04-04 ENCOUNTER — Telehealth: Payer: Self-pay | Admitting: Obstetrics and Gynecology

## 2018-04-04 NOTE — Telephone Encounter (Signed)
If UPT negative, can take Provera 10 mg x 10 days to try to stop current bleeding and then induce a cycle. I recommended she do a day number 23 Progesterone level in the lab, and she cancelled this.  I recommend she reschedule this.

## 2018-04-04 NOTE — Telephone Encounter (Signed)
Spoke with patient, advised as seen below per Dr. Quincy Simmonds. Patient will take UPT today and call with update later, will send in RX and schedule Progesterone labs at that time if UPT neg. Patient verbalizes understanding.

## 2018-04-04 NOTE — Telephone Encounter (Signed)
Spoke with patient. Reports Hx of PCOS. Stopped POP 02/2018 due to side effects. Trying for pregnancy. Reports cycles have been irregular, menses 1/17-1/24 and again on 2/3 to current. One episode of LLQ pain on 2/19 that was brief and resolved. Denies fever/chills, N/V, heavy bleeding or pain. Has not taken UPT.   Recommended OV for further evaluation, patient declined. Advised to take UPT, if negative, continue to monitor menses, if they continue to be irregular or become heavy, return call to office for OV. If UPT positive, return call to office for OV, if after hours go to [facility] for further evaluation. Advised Dr. Quincy Simmonds will review, I will return call if any additional recommendations. Patient agreeable.   Dr. Esau Grew additional recommendations?

## 2018-04-04 NOTE — Telephone Encounter (Signed)
Patient sent the following correspondence through [place]. Routing to triage to assist patient with request.  Dr Quincy Simmonds is it normal to have period twice and longer than 1 week?

## 2018-04-05 NOTE — Telephone Encounter (Signed)
Left message to call Sabra Sessler, RN at [facility] [phone].   

## 2018-04-11 NOTE — Telephone Encounter (Signed)
Left message, ok per dpr. Requesting return call to provide update. Return call to [place], [place] at [facility] at [phone].

## 2018-04-13 NOTE — Telephone Encounter (Signed)
Encounter closed by me.

## 2018-04-13 NOTE — Telephone Encounter (Signed)
Dr. Quincy Simmonds -attempted to reach patient x2, left detailed message requesting return call to provide update. OK to close encounter?

## 2018-04-18 ENCOUNTER — Encounter: Payer: Self-pay | Admitting: Osteopathic Medicine

## 2018-05-11 ENCOUNTER — Encounter: Payer: No Typology Code available for payment source | Admitting: Osteopathic Medicine

## 2018-05-19 ENCOUNTER — Telehealth: Payer: Self-pay | Admitting: Obstetrics and Gynecology

## 2018-05-19 NOTE — Telephone Encounter (Addendum)
Left patient a message to call back to reschedule a future appointment that was cancelled by Dr. Quincy Simmonds for: 3 mo POP check. Per MD: okay for a virtual visit, if desired.

## 2018-05-25 ENCOUNTER — Ambulatory Visit: Payer: Self-pay | Admitting: Obstetrics and Gynecology

## 2018-06-01 ENCOUNTER — Encounter: Payer: No Typology Code available for payment source | Admitting: Osteopathic Medicine

## 2018-06-07 ENCOUNTER — Ambulatory Visit: Payer: No Typology Code available for payment source | Admitting: Audiology

## 2018-07-12 ENCOUNTER — Encounter: Payer: No Typology Code available for payment source | Admitting: Osteopathic Medicine

## 2018-07-14 ENCOUNTER — Encounter: Payer: No Typology Code available for payment source | Admitting: Osteopathic Medicine

## 2018-07-25 ENCOUNTER — Ambulatory Visit: Payer: No Typology Code available for payment source | Admitting: Obstetrics and Gynecology

## 2018-07-27 ENCOUNTER — Encounter: Payer: Self-pay | Admitting: Osteopathic Medicine

## 2018-08-03 ENCOUNTER — Encounter: Payer: Self-pay | Admitting: Osteopathic Medicine

## 2018-08-03 ENCOUNTER — Ambulatory Visit (INDEPENDENT_AMBULATORY_CARE_PROVIDER_SITE_OTHER): Payer: No Typology Code available for payment source | Admitting: Osteopathic Medicine

## 2018-08-03 VITALS — BP 130/79 | HR 84 | Temp 98.0°F | Wt 150.2 lb

## 2018-08-03 DIAGNOSIS — Z0000 Encounter for general adult medical examination without abnormal findings: Secondary | ICD-10-CM | POA: Diagnosis not present

## 2018-08-03 DIAGNOSIS — Z8349 Family history of other endocrine, nutritional and metabolic diseases: Secondary | ICD-10-CM | POA: Diagnosis not present

## 2018-08-03 DIAGNOSIS — E782 Mixed hyperlipidemia: Secondary | ICD-10-CM | POA: Insufficient documentation

## 2018-08-03 DIAGNOSIS — H7292 Unspecified perforation of tympanic membrane, left ear: Secondary | ICD-10-CM | POA: Insufficient documentation

## 2018-08-03 DIAGNOSIS — E282 Polycystic ovarian syndrome: Secondary | ICD-10-CM

## 2018-08-03 DIAGNOSIS — N926 Irregular menstruation, unspecified: Secondary | ICD-10-CM

## 2018-08-03 NOTE — Patient Instructions (Addendum)
General Preventive Care  Most recent routine screening lipids/other labs: ordered today.   Everyone should have blood pressure checked once per year. Goal 130/80  Tobacco: don't!   Alcohol: responsible moderation is ok for most adults - if you have concerns about your alcohol intake, please talk to me!   Exercise: as tolerated to reduce risk of cardiovascular disease and diabetes. Strength training will also prevent osteoporosis.   Mental health: if need for mental health care (medicines, counseling, other), or concerns about moods, please let me know!   Sexual health: if need for STD testing, or if concerns with libido/pain problems, please let me know! If you need to discuss your birth control options, please let me or your OBGYN know!   Advanced Directive: Living Will and/or Healthcare Power of Attorney recommended for all adults, regardless of age or health.  Vaccines  Flu vaccine: recommended for almost everyone, every fall.   Shingles vaccine: Shingrix recommended after age 49.   Pneumonia vaccines: after age 61, or sooner if certain medical conditions.  Tetanus booster: Tdap recommended every 10 years.   HPV vaccine: Gardasil is an option up to age 79 to prevent HPV-associated diseases, including certain cancers.  Cancer screenings   Colon cancer screening: recommended for everyone at age 62  Breast cancer screening: mammogram recommended at age 65   Cervical cancer screening: Pap every 1 to 5 years depending on age and other risk factors.   Lung cancer screening: not needed for non-smokers  Infection screenings . HIV, Gonorrhea/Chlamydia: screening as needed. . Hepatitis C: recommended for anyone born 60-1964 . TB: certain at-risk populations, or depending on work requirements and/or travel history Other . Bone Density Test: recommended for women at age 45

## 2018-08-03 NOTE — Progress Notes (Signed)
HPI: Julia Carter is a 33 y.o. female who  has a past medical history of Elevated cholesterol (2019) and Migraine.  she presents to [facility] today, 08/03/18,  for chief complaint of: Annual physical     Patient here for annual physical / wellness exam.  See preventive care reviewed as below.  Recent labs reviewed in detail with the patient.   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 Rupture of left tympanic membrane 08/03/2018  . PCOS (polycystic ovarian syndrome) 08/03/2018  . Mixed hyperlipidemia 08/03/2018  . Family history of thyroid disease 08/03/2018  . Irregular menses 07/23/2017    No past surgical history on file.  Social History   Tobacco Use  . Smoking status: Passive Smoke Exposure - Never Smoker  . Smokeless tobacco: Never Used  Substance Use Topics  . Alcohol use: Not Currently    Family History  Problem Relation Age of Onset  . Arthritis Father   . Polycystic ovary syndrome Sister   . Polycystic ovary syndrome Sister   . Goiter Sister   . Thyroid disease Sister      Current medication list and allergy/intolerance information reviewed:    Current Outpatient Medications  Medication Sig Dispense Refill  . ibuprofen (ADVIL,MOTRIN) 800 MG tablet Take 1 tablet (800 mg total) by mouth every 8 (eight) hours as needed. (Patient not taking: Reported on 08/03/2018) 30 tablet 0  . norethindrone (MICRONOR,CAMILA,ERRIN) 0.35 MG tablet Take 1 tablet (0.35 mg total) by mouth daily. (Patient not taking: Reported on 01/13/2018) 1 Package 3   No current facility-administered medications for this visit.     Allergies  Allergen Reactions  . Other     Some fresh fruits      Review of  Systems:  Constitutional:  No  fever, no chills, No recent illness  HEENT: No  headache, no vision change  Cardiac: No  chest pain, No  pressure  Respiratory:  No  shortness of breath. No  Cough  Gastrointestinal: No  abdominal pain, No  nausea  Musculoskeletal: No new myalgia/arthralgia  Skin: No  Rash  Genitourinary: No  incontinence  Hem/Onc: No  easy bruising/bleeding, No  abnormal lymph node  Endocrine: No cold intolerance,  No heat intolerance  Neurologic: No  weakness, No  dizziness  Psychiatric: No  concerns with depression, No  concerns with anxiety, No sleep problems, No mood problems  Exam:  BP 130/79 (BP Location: Left Arm, Patient Position: Sitting, Cuff Size: Normal)   Pulse 84   Temp 98 F (36.7 C) (Oral)   Wt 150 lb 3.2 oz (68.1 kg)   BMI 26.40 kg/m   Constitutional: VS see above. General Appearance: alert, well-developed, well-nourished, NAD  Eyes: Normal lids and conjunctive, non-icteric sclera  Ears, Nose, Mouth, Throat: MMM, Normal external inspection ears. 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  Neck: No masses, trachea midline. No thyroid enlargement. No tenderness/mass appreciated. No lymphadenopathy  Respiratory: Normal respiratory effort. no wheeze, no rhonchi, no rales  Cardiovascular: S1/S2 normal, no murmur, no rub/gallop auscultated. RRR. No lower extremity edema. Pedal pulse II/IV bilaterally DP and PT. No carotid bruit or JVD. No abdominal aortic bruit.  Gastrointestinal: Nontender, no masses. No hepatomegaly, no splenomegaly. No hernia  appreciated. Bowel sounds normal. Rectal exam deferred.   Musculoskeletal: Gait normal. No clubbing/cyanosis of digits.   Neurological: Normal balance/coordination. No tremor.  Skin: warm, dry, intact. No rash/ulcer. No concerning nevi or subq nodules on limited exam.    Psychiatric: Normal judgment/insight. Normal mood and affect. Oriented x3.      ASSESSMENT/PLAN: The primary encounter  diagnosis was Annual physical exam. Diagnoses of Family history of thyroid disease, Mixed hyperlipidemia, PCOS (polycystic ovarian syndrome), Irregular menses, and Rupture of left tympanic membrane were also pertinent to this visit.  Following with OB/GYN for Pap/well woman care Patient does not have vaccine records on file.  Orders Placed This Encounter  Procedures  . CBC  . COMPLETE METABOLIC PANEL WITH GFR  . Lipid panel  . TSH    No orders of the defined types were placed in this encounter.   Patient Instructions  General Preventive Care  Most recent routine screening lipids/other labs: ordered today.   Everyone should have blood pressure checked once per year. Goal 130/80  Tobacco: don't!   Alcohol: responsible moderation is ok for most adults - if you have concerns about your alcohol intake, please talk to me!   Exercise: as tolerated to reduce risk of cardiovascular disease and diabetes. Strength training will also prevent osteoporosis.   Mental health: if need for mental health care (medicines, counseling, other), or concerns about moods, please let me know!   Sexual health: if need for STD testing, or if concerns with libido/pain problems, please let me know! If you need to discuss your birth control options, please let me or your OBGYN know!   Advanced Directive: Living Will and/or Healthcare Power of Attorney recommended for all adults, regardless of age or health.  Vaccines  Flu vaccine: recommended for almost everyone, every fall.   Shingles vaccine: Shingrix recommended after age 62.   Pneumonia vaccines: after age 6, or sooner if certain medical conditions.  Tetanus booster: Tdap recommended every 10 years.   HPV vaccine: Gardasil is an option up to age 88 to prevent HPV-associated diseases, including certain cancers.  Cancer screenings   Colon cancer screening: recommended for everyone at age 24  Breast cancer screening: mammogram recommended at age  24   Cervical cancer screening: Pap every 1 to 5 years depending on age and other risk factors.   Lung cancer screening: not needed for non-smokers  Infection screenings . HIV, Gonorrhea/Chlamydia: screening as needed. . Hepatitis C: recommended for anyone born 81-1964 . TB: certain at-risk populations, or depending on work requirements and/or travel history Other . Bone Density Test: recommended for women at age 67        Visit summary with medication list and pertinent instructions was printed for patient to review. All questions at time of visit were answered - patient instructed to contact office with any additional concerns or updates. ER/RTC precautions were reviewed with the patient.     Please note: voice recognition software was used to produce this document, and typos may escape review. Please contact Dr. Sheppard Coil for any needed clarifications.     Follow-up plan: Return in about 1 year (around 08/03/2019) for annual physical, sooner if needed .

## 2018-08-04 LAB — LIPID PANEL
Cholesterol: 230 mg/dL — ABNORMAL HIGH (ref <200)
HDL: 41 mg/dL — ABNORMAL LOW (ref >=50)
LDL Cholesterol (Calc): 163 mg/dL (calc) — ABNORMAL HIGH
Non-HDL Cholesterol (Calc): 189 mg/dL (calc) — ABNORMAL HIGH (ref <130)
Total CHOL/HDL Ratio: 5.6 (calc) — ABNORMAL HIGH (ref <5.0)
Triglycerides: 140 mg/dL (ref <150)

## 2018-08-04 LAB — COMPLETE METABOLIC PANEL WITH GFR
AG Ratio: 1.4 (calc) (ref 1.0–2.5)
ALT: 18 U/L (ref 6–29)
AST: 14 U/L (ref 10–30)
Albumin: 4.4 g/dL (ref 3.6–5.1)
Alkaline phosphatase (APISO): 76 U/L (ref 31–125)
BUN: 11 mg/dL (ref 7–25)
CO2: 29 mmol/L (ref 20–32)
Calcium: 9.9 mg/dL (ref 8.6–10.2)
Chloride: 104 mmol/L (ref 98–110)
Creat: 0.69 mg/dL (ref 0.50–1.10)
GFR, Est African American: 134 mL/min/{1.73_m2} (ref >=60)
GFR, Est Non African American: 116 mL/min/{1.73_m2} (ref >=60)
Globulin: 3.2 g/dL (calc) (ref 1.9–3.7)
Glucose, Bld: 89 mg/dL (ref 65–99)
Potassium: 4 mmol/L (ref 3.5–5.3)
Sodium: 141 mmol/L (ref 135–146)
Total Bilirubin: 0.6 mg/dL (ref 0.2–1.2)
Total Protein: 7.6 g/dL (ref 6.1–8.1)

## 2018-08-04 LAB — CBC
HCT: 43.4 % (ref 35.0–45.0)
Hemoglobin: 14.1 g/dL (ref 11.7–15.5)
MCH: 26.9 pg — ABNORMAL LOW (ref 27.0–33.0)
MCHC: 32.5 g/dL (ref 32.0–36.0)
MCV: 82.8 fL (ref 80.0–100.0)
MPV: 10.1 fL (ref 7.5–12.5)
Platelets: 392 10*3/uL (ref 140–400)
RBC: 5.24 10*6/uL — ABNORMAL HIGH (ref 3.80–5.10)
RDW: 14 % (ref 11.0–15.0)
WBC: 9.4 10*3/uL (ref 3.8–10.8)

## 2018-08-04 LAB — TSH: TSH: 1.49 mIU/L

## 2018-09-09 ENCOUNTER — Encounter: Payer: Self-pay | Admitting: Osteopathic Medicine

## 2018-09-24 ENCOUNTER — Encounter: Payer: Self-pay | Admitting: Osteopathic Medicine

## 2018-09-28 ENCOUNTER — Encounter: Payer: Self-pay | Admitting: Osteopathic Medicine

## 2018-09-28 ENCOUNTER — Telehealth: Payer: Self-pay | Admitting: Osteopathic Medicine

## 2018-09-28 ENCOUNTER — Telehealth (INDEPENDENT_AMBULATORY_CARE_PROVIDER_SITE_OTHER): Payer: No Typology Code available for payment source | Admitting: Osteopathic Medicine

## 2018-09-28 ENCOUNTER — Other Ambulatory Visit: Payer: Self-pay

## 2018-09-28 DIAGNOSIS — Z1383 Encounter for screening for respiratory disorder NEC: Secondary | ICD-10-CM | POA: Diagnosis not present

## 2018-09-28 DIAGNOSIS — K5900 Constipation, unspecified: Secondary | ICD-10-CM

## 2018-09-28 NOTE — Progress Notes (Signed)
Virtual Visit via Video (App used: MyChart) Note  I connected with      Julia Carter on 09/28/18 at 8:41 AM  by a telemedicine application and verified that I am speaking with the correct person using two identifiers.  Patient is at home I am in office   I discussed the limitations of evaluation and management by telemedicine and the availability of in person appointments. The patient expressed understanding and agreed to proceed.  History of Present Illness: Julia Carter is a 33 y.o. female who would like to discuss constipation, concerns about COVID exposure  Drinking warm water w/ lemon in morning, drinks coffee, veggies for much, fruits for dinner. Has cut back on meats and fats as she's trying to improve cholesterol numbers.  Over the past week, stool has been really hard to pass, when she called yesterday to make the appointment the nurse recommended regularly drinking 8 glasses of water, patient however drank 8 glasses immediately after that as well as took a laxative and she did pass some stool yesterday. Had tried laxatives, not sure name of medication.   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 Had mask on, took shower when she got home, sanitizing a lot, sleeping separately from husband, she's very anxious about exposure. No symptoms.        Observations/Objective: LMP 06/23/2017  BP Readings from Last 3 Encounters:  08/03/18 130/79  03/11/18 128/62  01/19/18 (!) 152/87   Exam: Normal Speech.  NAD  Lab and Radiology Results No results found for this or any previous visit (from the past 72 hour(s)). No results found.  Depression screen [facility] 2/9 09/28/2018 08/03/2018 01/13/2018  Decreased Interest 0 0 0  Down, Depressed, Hopeless 1 0 0  PHQ - 2 Score 1 0 0  Altered sleeping 1 0 1  Tired, decreased energy 1 1 0  Change in appetite 2 3 1   Feeling bad or failure about yourself  1 3 0  Trouble concentrating 3 2 1   Moving slowly or fidgety/restless 1 0  0  Suicidal thoughts 0 0 0  PHQ-9 Score 10 9 3   Difficult doing work/chores Not difficult at all Not difficult at all Not difficult at all      Assessment and Plan: 33 y.o. female with The primary encounter diagnosis was Constipation, unspecified constipation type. A diagnosis of Screening for respiratory condition was also pertinent to this visit.  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  I will see if she would qualify for getting self swab testing here or if we should send her to drive-through testing at [place] not reviewed this encounter. No orders of the defined types were placed in this encounter.  No orders of the defined types were placed in this encounter.  Patient Instructions  CONSTIPATION OVERVIEW - Constipation refers to a change in bowel habits, but it has varied meanings. Stools may be too hard or too small, difficult to pass, or infrequent (less than three times per week). People with constipation may also notice a frequent need to strain and a sense that the bowels are not empty.  CONSTIPATION DIAGNOSIS - Constipation can usually be diagnosed based upon your symptoms and a physical examination. You should also mention any medications you take regularly since some medications can cause   Further testing may be ordered  in some situations, for example, if you have had a recent change in bowel habits, blood in the stool, weight loss, or a family history of colon cancer. Testing may include blood tests, x-rays, sigmoidoscopy, colonoscopy, or more specialized testing if needed.   When to seek help - Most people can treat constipation at home, without seeing a healthcare provider. However, you should speak with a healthcare provider if the problem: ?Is new (ie, represents a change in your normal pattern) ?Lasts longer than three weeks ?Is  severe ?Is associated with any other concerning features such as blood on the toilet paper, weight loss, fevers, or weakness   CONSTIPATION TREATMENT - Treatment for constipation includes changing some behaviors, eating foods high in fiber, and using laxatives or enemas if needed.  You can try these treatments at home, before seeing a healthcare provider. However, if you do not have a bowel movement within a few days, you should call your healthcare provider for further assistance.   Behavior changes - The bowels are most active following meals, and this is often the time when stools will pass most readily. If you ignore your body's signals to have a bowel movement, the signals become weaker and weaker over time. By paying close attention to these signals, you may have an easier time moving your bowels. Drinking a caffeine-containing beverage in the morning may also be helpful.  Increase fiber - Increasing fiber in your diet may reduce or eliminate constipation. The recommended amount of dietary fiber is 20 to 35 grams of fiber per day. By reading the product information panel on the side of the package, you can determine the number of grams of fiber per serving Many fruits and vegetables can be particularly helpful in preventing and treating constipation. This is especially true of citrus fruits, prunes, and prune juice. Some breakfast cereals are also an excellent source of dietary fiber.  Fiber side effects - Consuming large amounts of fiber can cause abdominal bloating or gas; this can be minimized by starting with a small amount and slowly increasing until stools become softer and more frequent.   LAXATIVES - If behavior changes and increasing fiber does not relieve your constipation, you may try taking a laxative. A variety of laxatives are available for treating constipation. The choice between them is based upon how they work, how safe the treatment is, and your healthcare provider's  preferences. In general, laxatives can be categorized into the following groups:  OK TO TAKE DAILY   Bulk forming laxatives - These include natural fiber and commercial fiber preparations such as: ?Psyllium (Konsyl; Metamucil; Perdiem) ?Methylcellulose (Citrucel) ?Calcium polycarbophil (FiberCon; Fiber-Lax; Mitrolan) ?Wheat dextrin (Benefiber) You should increase the dose of fiber supplements slowly to prevent gas and cramping, and you should always take the supplement with plenty of fluid.  Hyperosmolar laxatives - Hyperosmolar laxatives include: ?Polyethylene glycol (MiraLax, Glycolax) Polyethylene glycol is generally preferred since it does not cause gas or bloating and is available in the [place] without a prescription. L  OK TO ADD TO DAILY MEDICATIONS IF NEEDED  Saline laxatives - Saline laxatives such as magnesium hydroxide (Milk of Magnesia) and magnesium citrate (Evac-Q-Mag) act similarly to the hyperosmolar laxatives.  Stimulant laxatives - Stimulant laxatives include senna (eg, Black Draught, Ex-lax, Fletcher's, Castoria, Senokot) and bisacodyl (eg, Correctol, Doxidan, Dulcolax). Some people overuse stimulant laxatives. Taking stimulant laxatives regularly or in large amounts can cause side effects, including low potassium levels.   LONG TERM PRESCRIPTIONS   New treatments -  Lubiprostone (Amitiza) is a prescription medication that treats severe constipation. It is expensive, but it may be recommended if you do not respond to other treatments. Linaclotide (Linzess) is another prescription medication that has been approved for the treatment of chronic constipation. It is also an expensive medication as compared with other agents with the exception of lubiprostone. Linaclotide may be recommended if you do not respond to other treatments.   OTHER:   Pills, suppositories, or enemas? - Laxatives are available as pills that you take by mouth or as suppositories or enemas that  you insert into the rectum. In general, suppositories and enemas work more quickly compared to pills, but many people do not like using them.  Healthcare providers occasionally recommend prepackaged enema kits containing sodium phosphate/biphosphate (Fleet) if you have not responded to other treatments. These are not recommended if you have problems with your heart or kidneys, and should not be used more than once unless directed by your healthcare provider.  Constipation treatments to AVOID ?Emollients - Emollient laxatives, principally mineral oil, soften stools by moisturizing them. However, other treatments have fewer risks and equal benefit. ?Natural products - A wide variety of natural products are advertised for constipation. Some of them contain the active ingredients found in commercially available laxatives. However, their dose and purity may not be carefully controlled. Thus, these products are not generally recommended.  A variety of home-made enema preparations have been used throughout the years, such as soapsuds, hydrogen peroxide, and household detergents. These can be extremely irritating to the lining of the intestine and should be avoided.     Instructions sent via MyChart. If MyChart not available, pt was given option for info via personal e-mail w/ no guarantee of protected health info over unsecured e-mail communication, and MyChart sign-up instructions were included.   Follow Up Instructions: Return for RECHECK PENDING RESULTS / IF WORSE OR CHANGE.    I discussed the assessment and treatment plan with the patient. The patient was provided an opportunity to ask questions and all were answered. The patient agreed with the plan and demonstrated an understanding of the instructions.   The patient was advised to call back or seek an in-person evaluation if any new concerns, if symptoms worsen or if the condition fails to improve as anticipated.  30 minutes of non-face-to-face  time was provided during this encounter.                      Historical information moved to improve visibility of documentation.  Past Medical History:  Diagnosis Date  . Elevated cholesterol 2019  . History of PCOS   . Migraine    with aura   No past surgical history on file. Social History   Tobacco Use  . Smoking status: Passive Smoke Exposure - Never Smoker  . Smokeless tobacco: Never Used  Substance Use Topics  . Alcohol use: Not Currently   family history includes Arthritis in her father; Goiter in her sister; Polycystic ovary syndrome in her sister and sister; Thyroid disease in her sister.  Medications: Current Outpatient Medications  Medication Sig Dispense Refill  . ibuprofen (ADVIL,MOTRIN) 800 MG tablet Take 1 tablet (800 mg total) by mouth every 8 (eight) hours as needed. (Patient not taking: Reported on 08/03/2018) 30 tablet 0  . norethindrone (MICRONOR,CAMILA,ERRIN) 0.35 MG tablet Take 1 tablet (0.35 mg total) by mouth daily. (Patient not taking: Reported on 01/13/2018) 1 Package 3   No current facility-administered medications for this  visit.    Allergies  Allergen Reactions  . Norethindrone Other (See Comments)    Severe cramping/numbness in lower extremities & back. Was unable to walk.  . Other     Some fresh fruits    PDMP not reviewed this encounter. No orders of the defined types were placed in this encounter.  No orders of the defined types were placed in this encounter.

## 2018-09-28 NOTE — Telephone Encounter (Signed)
Please call patient: Given potential exposure that she has had, if she desires testing she should get this done at the [place] [facility] at [place].  I have placed order, she does not need an appointment.

## 2018-09-28 NOTE — Telephone Encounter (Signed)
Thanks for the update

## 2018-09-28 NOTE — Patient Instructions (Signed)
CONSTIPATION OVERVIEW - Constipation refers to a change in bowel habits, but it has varied meanings. Stools may be too hard or too small, difficult to pass, or infrequent (less than three times per week). People with constipation may also notice a frequent need to strain and a sense that the bowels are not empty.  CONSTIPATION DIAGNOSIS - Constipation can usually be diagnosed based upon your symptoms and a physical examination. You should also mention any medications you take regularly since some medications can cause   Further testing may be ordered in some situations, for example, if you have had a recent change in bowel habits, blood in the stool, weight loss, or a family history of colon cancer. Testing may include blood tests, x-rays, sigmoidoscopy, colonoscopy, or more specialized testing if needed.   When to seek help - Most people can treat constipation at home, without seeing a healthcare provider. However, you should speak with a healthcare provider if the problem: ?Is new (ie, represents a change in your normal pattern) ?Lasts longer than three weeks ?Is severe ?Is associated with any other concerning features such as blood on the toilet paper, weight loss, fevers, or weakness   CONSTIPATION TREATMENT - Treatment for constipation includes changing some behaviors, eating foods high in fiber, and using laxatives or enemas if needed.  You can try these treatments at home, before seeing a healthcare provider. However, if you do not have a bowel movement within a few days, you should call your healthcare provider for further assistance.   Behavior changes - The bowels are most active following meals, and this is often the time when stools will pass most readily. If you ignore your body's signals to have a bowel movement, the signals become weaker and weaker over time. By paying close attention to these signals, you may have an easier time moving your bowels. Drinking a caffeine-containing  beverage in the morning may also be helpful.  Increase fiber - Increasing fiber in your diet may reduce or eliminate constipation. The recommended amount of dietary fiber is 20 to 35 grams of fiber per day. By reading the product information panel on the side of the package, you can determine the number of grams of fiber per serving Many fruits and vegetables can be particularly helpful in preventing and treating constipation. This is especially true of citrus fruits, prunes, and prune juice. Some breakfast cereals are also an excellent source of dietary fiber.  Fiber side effects - Consuming large amounts of fiber can cause abdominal bloating or gas; this can be minimized by starting with a small amount and slowly increasing until stools become softer and more frequent.   LAXATIVES - If behavior changes and increasing fiber does not relieve your constipation, you may try taking a laxative. A variety of laxatives are available for treating constipation. The choice between them is based upon how they work, how safe the treatment is, and your healthcare provider's preferences. In general, laxatives can be categorized into the following groups:  OK TO TAKE DAILY   Bulk forming laxatives - These include natural fiber and commercial fiber preparations such as: ?Psyllium (Konsyl; Metamucil; Perdiem) ?Methylcellulose (Citrucel) ?Calcium polycarbophil (FiberCon; Fiber-Lax; Mitrolan) ?Wheat dextrin (Benefiber) You should increase the dose of fiber supplements slowly to prevent gas and cramping, and you should always take the supplement with plenty of fluid.  Hyperosmolar laxatives - Hyperosmolar laxatives include: ?Polyethylene glycol (MiraLax, Glycolax) Polyethylene glycol is generally preferred since it does not cause gas or bloating and is  available in the [place] without a prescription. L  OK TO ADD TO DAILY MEDICATIONS IF NEEDED  Saline laxatives - Saline laxatives such as magnesium  hydroxide (Milk of Magnesia) and magnesium citrate (Evac-Q-Mag) act similarly to the hyperosmolar laxatives.  Stimulant laxatives - Stimulant laxatives include senna (eg, Black Draught, Ex-lax, Fletcher's, Castoria, Senokot) and bisacodyl (eg, Correctol, Doxidan, Dulcolax). Some people overuse stimulant laxatives. Taking stimulant laxatives regularly or in large amounts can cause side effects, including low potassium levels.   LONG TERM PRESCRIPTIONS   New treatments - Lubiprostone (Amitiza) is a prescription medication that treats severe constipation. It is expensive, but it may be recommended if you do not respond to other treatments. Linaclotide (Linzess) is another prescription medication that has been approved for the treatment of chronic constipation. It is also an expensive medication as compared with other agents with the exception of lubiprostone. Linaclotide may be recommended if you do not respond to other treatments.   OTHER:   Pills, suppositories, or enemas? - Laxatives are available as pills that you take by mouth or as suppositories or enemas that you insert into the rectum. In general, suppositories and enemas work more quickly compared to pills, but many people do not like using them.  Healthcare providers occasionally recommend prepackaged enema kits containing sodium phosphate/biphosphate (Fleet) if you have not responded to other treatments. These are not recommended if you have problems with your heart or kidneys, and should not be used more than once unless directed by your healthcare provider.  Constipation treatments to AVOID ?Emollients - Emollient laxatives, principally mineral oil, soften stools by moisturizing them. However, other treatments have fewer risks and equal benefit. ?Natural products - A wide variety of natural products are advertised for constipation. Some of them contain the active ingredients found in commercially available laxatives. However, their dose  and purity may not be carefully controlled. Thus, these products are not generally recommended.  A variety of home-made enema preparations have been used throughout the years, such as soapsuds, hydrogen peroxide, and household detergents. These can be extremely irritating to the lining of the intestine and should be avoided.

## 2018-09-28 NOTE — Telephone Encounter (Signed)
Pt left multiple vm msgs earlier this afternoon regarding info for COVID testing. Was she contacted already? Pls advise, thanks.

## 2018-09-28 NOTE — Telephone Encounter (Signed)
Patient sent Mychart message stating that she was on her way for testing.  Charyl Bigger, CMA

## 2018-10-10 ENCOUNTER — Other Ambulatory Visit: Payer: Self-pay

## 2018-10-10 ENCOUNTER — Emergency Department
Admission: EM | Admit: 2018-10-10 | Discharge: 2018-10-10 | Disposition: A | Discharge status: Home / Self Care | Payer: No Typology Code available for payment source | Source: Home / Self Care

## 2018-10-10 DIAGNOSIS — Z711 Person with feared health complaint in whom no diagnosis is made: Secondary | ICD-10-CM | POA: Diagnosis not present

## 2018-10-10 DIAGNOSIS — K219 Gastro-esophageal reflux disease without esophagitis: Secondary | ICD-10-CM

## 2018-10-10 MED ORDER — OMEPRAZOLE 20 MG PO CPDR: 20.0000 mg | DELAYED_RELEASE_CAPSULE | Freq: Every day | ORAL | 0 refills | Status: DC

## 2018-10-10 NOTE — Discharge Instructions (Signed)
°  You may try the omeprazole to help with acid reflux sensation.  It is recommended you follow up with family medicine later this week if not improving.  Call 911 or have someone drive you to the hospital if symptoms worsening.

## 2018-10-10 NOTE — ED Provider Notes (Signed)
[facility]    CSN: [id number] Arrival date & time: 10/10/18  1037     History   Chief Complaint Chief Complaint  Patient presents with  . Shortness of Breath    HPI Julia Carter is a 33 y.o. female.   HPI Julia Carter is a 33 y.o. female presenting to UC with c/o mild SOB that started this morning. Pt states it feels like she cannot take a full breat hat times and believes it is due to acid reflux or a new diet she started.  Denies cough, congestion, fever, chills, n/v/d. No chest pain or palpitations. 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 Negative test was performed on 09/29/2018 and came back on 10/03/2018.  Pt denies hx of anxiety or panic attacks but states she does have a family hx of anxiety.  No medication tried PTA. Denies SOB at this time.   Past Medical History:  Diagnosis Date  . Elevated cholesterol 2019  . History of PCOS   . Migraine    with aura    Patient Active Problem List   Diagnosis Date Noted  . Rupture of left tympanic membrane 08/03/2018  . PCOS (polycystic ovarian syndrome) 08/03/2018  . Mixed hyperlipidemia 08/03/2018  . Family history of thyroid disease 08/03/2018  . Irregular menses 07/23/2017    History reviewed. No pertinent surgical history.  OB History    Gravida  1   Para  1   Term  0   Preterm  0   AB  0   Living  1     SAB  0   TAB  0   Ectopic  0   Multiple  0   Live Births  0            Home Medications    Prior to Admission medications   Medication Sig Start Date End Date Taking? Authorizing Provider  ibuprofen (ADVIL,MOTRIN) 800 MG tablet Take 1 tablet (800 mg total) by mouth every 8 (eight) hours as needed. Patient not taking: Reported on 08/03/2018 03/11/18   Nunzio Cobbs, MD  norethindrone (MICRONOR,CAMILA,ERRIN) 0.35 MG tablet Take 1 tablet (0.35 mg total) by mouth daily. Patient not taking:  Reported on 01/13/2018 12/15/17   Nunzio Cobbs, MD  omeprazole (PRILOSEC) 20 MG capsule Take 1 capsule (20 mg total) by mouth daily. 10/10/18   Noe Gens, PA-C    Family History Family History  Problem Relation Age of Onset  . Arthritis Father   . Polycystic ovary syndrome Sister   . Polycystic ovary syndrome Sister   . Goiter Sister   . Thyroid disease Sister     Social History Social History   Tobacco Use  . Smoking status: Passive Smoke Exposure - Never Smoker  . Smokeless tobacco: Never Used  Substance Use Topics  . Alcohol use: Not Currently  . Drug use: Never     Allergies   Norethindrone and Other   Review of Systems Review of Systems  Constitutional: Negative for chills, diaphoresis, fatigue and fever.  HENT: Negative for congestion, ear pain, sinus pressure and sore throat.   Respiratory: Positive for chest tightness and shortness of breath. Negative for cough, choking, wheezing and stridor.   Cardiovascular: Negative for chest pain, palpitations and leg swelling.  Gastrointestinal: Negative for abdominal pain, constipation, diarrhea, nausea and vomiting.  Neurological: Negative for dizziness, light-headedness  and headaches.     Physical Exam Triage Vital Signs ED Triage Vitals  Enc Vitals Group     BP 10/10/18 1114 134/74     Pulse Rate 10/10/18 1114 75     Resp 10/10/18 1114 20     Temp 10/10/18 1114 98.2 F (36.8 C)     Temp Source 10/10/18 1114 Oral     SpO2 10/10/18 1114 100 %     Weight 10/10/18 1113 140 lb (63.5 kg)     Height 10/10/18 1113 5\' 3"  (1.6 m)     Head Circumference --      Peak Flow --      Pain Score 10/10/18 1113 0     Pain Loc --      Pain Edu? --      Excl. in [place]? --    No data found.  Updated Vital Signs BP 134/74 (BP Location: Right Arm)   Pulse 75   Temp 98.2 F (36.8 C) (Oral)   Resp 20   Ht 5\' 3"  (1.6 m)   Wt 140 lb (63.5 kg)   SpO2 100%   BMI 24.80 kg/m   Visual Acuity Right Eye  Distance:   Left Eye Distance:   Bilateral Distance:    Right Eye Near:   Left Eye Near:    Bilateral Near:     Physical Exam Vitals signs and nursing note reviewed.  Constitutional:      General: She is not in acute distress.    Appearance: She is well-developed. She is not ill-appearing, toxic-appearing or diaphoretic.     Comments: Pt sitting calmly on exam table with mask on, NAD. Alert and cooperative during exam.  HENT:     Head: Normocephalic and atraumatic.  Neck:     Musculoskeletal: Normal range of motion and neck supple.  Cardiovascular:     Rate and Rhythm: Normal rate and regular rhythm.  Pulmonary:     Effort: Pulmonary effort is normal.     Breath sounds: Normal breath sounds. No decreased breath sounds, wheezing, rhonchi or rales.  Abdominal:     General: There is no distension.     Palpations: Abdomen is soft.     Tenderness: There is no abdominal tenderness.  Musculoskeletal: Normal range of motion.  Skin:    General: Skin is warm and dry.  Neurological:     Mental Status: She is alert and oriented to person, place, and time.  Psychiatric:        Behavior: Behavior normal.      UC Treatments / Results  Labs (all labs ordered are listed, but only abnormal results are displayed) Labs Reviewed - No data to display  EKG   Radiology No results found.  Procedures Procedures (including critical care time)  Medications Ordered in UC Medications - No data to display  Initial Impression / Assessment and Plan / UC Course  I have reviewed the triage vital signs and the nursing notes.  Pertinent labs & imaging results that were available during my care of the patient were reviewed by me and considered in my medical decision making (see chart for details).     Reassured pt of normal vital signs and exam. Will have pt try trial of omeprazole as she believes symptoms could be due to GERD. Encouraged f/u with PCP this week for recheck of symptoms.  Discussed symptoms that warrant emergent care in the ED. AVS provided.  Final Clinical Impressions(s) / UC Diagnoses   Final  diagnoses:  Physically well but worried  Mild acid reflux     Discharge Instructions      You may try the omeprazole to help with acid reflux sensation.  It is recommended you follow up with family medicine later this week if not improving.  Call 911 or have someone drive you to the hospital if symptoms worsening.     ED Prescriptions    Medication Sig Dispense Auth. Provider   omeprazole (PRILOSEC) 20 MG capsule Take 1 capsule (20 mg total) by mouth daily. 30 capsule Noe Gens, PA-C     Controlled Substance Prescriptions [place] Controlled Substance Registry consulted? Not Applicable   Tyrell Antonio 10/10/18 1338

## 2018-10-10 NOTE — ED Triage Notes (Signed)
Pt states that it feels like she is able to take deep breaths.  Has been going on for just this am.  Worked in the [facility] 2 weeks ago.  Quarantined herself, and was tested on August 6, and was negative on 10/03/18.

## 2018-10-25 ENCOUNTER — Encounter: Payer: Self-pay | Admitting: Obstetrics and Gynecology

## 2018-10-25 ENCOUNTER — Telehealth: Payer: Self-pay | Admitting: Obstetrics and Gynecology

## 2018-10-25 NOTE — Telephone Encounter (Signed)
[facility]  Phone Number: [phone]        Can I schedule a check up soon I have been spotting for almost 3 weeks.

## 2018-10-25 NOTE — Telephone Encounter (Signed)
Spoke with patient. Reports spotting for 2 weeks. LMP was approximately 6 months ago. UPT negative 2 wks ago. Sexually active, no contraceptive, stopped POP approximately 1 yr ago. Reports vaginal itching. Denies vag odor, urinary symptoms, pain, N/V, heavy bleeding, fever/chills. Patient requesting OV. Declines OV for today at 4pm. OV scheduled for 9/3 at 8am with Dr. Quincy Simmonds. Covid 19 prescreen negative, precautions reviewed.   Routing to provider for final review. Patient is agreeable to disposition. Will close encounter.

## 2018-10-26 NOTE — Progress Notes (Signed)
GYNECOLOGY  VISIT   HPI: 33 y.o.   Married  Asian  female   [place] with Patient's last menstrual period was 04/24/2018 (approximate).   here for irregular menses and vaginal and anal itching.  Patient states LNMP 6 months ago and has been spotting dark brown for 3 weeks.   She states she skipped her period for 3 months in the last 12 months.  She has her spotting or bleeding every 31 days, but could be every 90 days.  She states bleeding for 5 days at a time usually. Sometimes has little clots.  No pain.   She stopped trying for pregnancy due to Covid, but still undecided.  She is not taking a PNV.  Neg UPT at home yesterday.   She had leg cramps and abdominal cramping with Micronor.   She started exercising.   She has a hx of PCOS.  Her LH>FSH.  Normal TSH and prolactin.   She is using antibacterial soap.  UPT here negative.   GYNECOLOGIC HISTORY: Patient's last menstrual period was 04/24/2018 (approximate). Contraception: None Menopausal hormone therapy:  none Last mammogram:  n/a Last pap smear:  08-04-17 Neg:Neg HR HPV        OB History    Gravida  1   Para  1   Term  0   Preterm  0   AB  0   Living  1     SAB  0   TAB  0   Ectopic  0   Multiple  0   Live Births  0              Patient Active Problem List   Diagnosis Date Noted  . Rupture of left tympanic membrane 08/03/2018  . PCOS (polycystic ovarian syndrome) 08/03/2018  . Mixed hyperlipidemia 08/03/2018  . Family history of thyroid disease 08/03/2018  . Irregular menses 07/23/2017    Past Medical History:  Diagnosis Date  . Elevated cholesterol 2019  . History of PCOS   . Migraine    with aura    History reviewed. No pertinent surgical history.  No current outpatient medications on file.   No current facility-administered medications for this visit.      ALLERGIES: Norethindrone and Other  Family History  Problem Relation Age of Onset  . Arthritis Father   .  Polycystic ovary syndrome Sister   . Polycystic ovary syndrome Sister   . Goiter Sister   . Thyroid disease Sister     Social History   Socioeconomic History  . Marital status: Married    Spouse name: Not on file  . Number of children: 1  . Years of education: Not on file  . Highest education level: Not on file  Occupational History  . Occupation: [employer] Loss adjuster, chartered: [employer]  Social Needs  . Financial resource strain: Not on file  . Food insecurity    Worry: Not on file    Inability: Not on file  . Transportation needs    Medical: Not on file    Non-medical: Not on file  Tobacco Use  . Smoking status: Passive Smoke Exposure - Never Smoker  . Smokeless tobacco: Never Used  Substance and Sexual Activity  . Alcohol use: Not Currently  . Drug use: Never  . Sexual activity: Yes    Partners: Male    Birth control/protection: None  Lifestyle  . Physical activity    Days per week: Not on file  Minutes per session: Not on file  . Stress: Not on file  Relationships  . Social Herbalist on phone: Not on file    Gets together: Not on file    Attends religious service: Not on file    Active member of club or organization: Not on file    Attends meetings of clubs or organizations: Not on file    Relationship status: Not on file  . Intimate partner violence    Fear of current or ex partner: Not on file    Emotionally abused: Not on file    Physically abused: Not on file    Forced sexual activity: Not on file  Other Topics Concern  . Not on file  Social History Narrative  . Not on file    Review of Systems  Gastrointestinal:       Anal itching  All other systems reviewed and are negative.   PHYSICAL EXAMINATION:    BP 102/76   Pulse 66   Temp (!) 97.3 F (36.3 C) (Temporal)   Ht 5' 3.25" (1.607 m)   Wt 137 lb (62.1 kg)   LMP 04/24/2018 (Approximate)   BMI 24.08 kg/m     General appearance: alert, cooperative and appears stated  age  Pelvic: External genitalia:  no lesions              Urethra:  normal appearing urethra with no masses, tenderness or lesions              Bartholins and Skenes: normal                 Vagina: normal appearing vagina with normal color and discharge, no lesions              Cervix: no lesions.  Brown blood.                 Bimanual Exam:  Uterus:  normal size, contour, position, consistency, mobility, non-tender              Adnexa: no mass, fullness, tenderness                         Anus:  normal sphincter tone, no lesions.  Hemorrhoid formation.   Chaperone was present for exam.  ASSESSMENT  Anovulation. Hx PCOS. Intolerant of Micronor.  Migraine with aura.  PLAN  Provera 10 mg x 10 days.  Rationale explained.  She understands this is not birth control. Use condoms until annual exam completed at the end of the month. Start PNV.  Use Dove soap.  Try Preparation H.  Keep annual exam appointment.    An After Visit Summary was printed and given to the patient.  __15____ minutes face to face time of which over 50% was spent in counseling.

## 2018-10-27 ENCOUNTER — Ambulatory Visit (INDEPENDENT_AMBULATORY_CARE_PROVIDER_SITE_OTHER): Payer: No Typology Code available for payment source | Admitting: Obstetrics and Gynecology

## 2018-10-27 ENCOUNTER — Encounter: Payer: Self-pay | Admitting: Obstetrics and Gynecology

## 2018-10-27 ENCOUNTER — Other Ambulatory Visit: Payer: Self-pay

## 2018-10-27 VITALS — BP 102/76 | HR 66 | Temp 97.3°F | Ht 63.25 in | Wt 137.0 lb

## 2018-10-27 DIAGNOSIS — K649 Unspecified hemorrhoids: Secondary | ICD-10-CM | POA: Diagnosis not present

## 2018-10-27 DIAGNOSIS — N926 Irregular menstruation, unspecified: Secondary | ICD-10-CM | POA: Diagnosis not present

## 2018-10-27 LAB — POCT URINE PREGNANCY: Preg Test, Ur: NEGATIVE

## 2018-10-27 MED ORDER — MEDROXYPROGESTERONE ACETATE 10 MG PO TABS: 10.0000 mg | ORAL_TABLET | Freq: Every day | ORAL | 0 refills | Status: DC

## 2018-10-27 NOTE — Patient Instructions (Signed)
Hemorrhoids Hemorrhoids are swollen veins that may develop:  In the butt (rectum). These are called internal hemorrhoids.  Around the opening of the butt (anus). These are called external hemorrhoids. Hemorrhoids can cause pain, itching, or bleeding. Most of the time, they do not cause serious problems. They usually get better with diet changes, lifestyle changes, and other home treatments. What are the causes? This condition may be caused by:  Having trouble pooping (constipation).  Pushing hard (straining) to poop.  Watery poop (diarrhea).  Pregnancy.  Being very overweight (obese).  Sitting for long periods of time.  Heavy lifting or other activity that causes you to strain.  Anal sex.  Riding a bike for a long period of time. What are the signs or symptoms? Symptoms of this condition include:  Pain.  Itching or soreness in the butt.  Bleeding from the butt.  Leaking poop.  Swelling in the area.  One or more lumps around the opening of your butt. How is this diagnosed? A doctor can often diagnose this condition by looking at the affected area. The doctor may also:  Do an exam that involves feeling the area with a gloved hand (digital rectal exam).  Examine the area inside your butt using a small tube (anoscope).  Order blood tests. This may be done if you have lost a lot of blood.  Have you get a test that involves looking inside the colon using a flexible tube with a camera on the end (sigmoidoscopy or colonoscopy). How is this treated? This condition can usually be treated at home. Your doctor may tell you to change what you eat, make lifestyle changes, or try home treatments. If these do not help, procedures can be done to remove the hemorrhoids or make them smaller. These may involve:  Placing rubber bands at the base of the hemorrhoids to cut off their blood supply.  Injecting medicine into the hemorrhoids to shrink them.  Shining a type of light  energy onto the hemorrhoids to cause them to fall off.  Doing surgery to remove the hemorrhoids or cut off their blood supply. Follow these instructions at home: Eating and drinking   Eat foods that have a lot of fiber in them. These include whole grains, beans, nuts, fruits, and vegetables.  Ask your doctor about taking products that have added fiber (fibersupplements).  Reduce the amount of fat in your diet. You can do this by: ? Eating low-fat dairy products. ? Eating less red meat. ? Avoiding processed foods.  Drink enough fluid to keep your pee (urine) pale yellow. Managing pain and swelling   Take a warm-water bath (sitz bath) for 20 minutes to ease pain. Do this 3-4 times a day. You may do this in a bathtub or using a portable sitz bath that fits over the toilet.  If told, put ice on the painful area. It may be helpful to use ice between your warm baths. ? Put ice in a plastic bag. ? Place a towel between your skin and the bag. ? Leave the ice on for 20 minutes, 2-3 times a day. General instructions  Take over-the-counter and prescription medicines only as told by your doctor. ? Medicated creams and medicines may be used as told.  Exercise often. Ask your doctor how much and what kind of exercise is best for you.  Go to the bathroom when you have the urge to poop. Do not wait.  Avoid pushing too hard when you poop.  Keep your   butt dry and clean. Use wet toilet paper or moist towelettes after pooping.  Do not sit on the toilet for a long time.  Keep all follow-up visits as told by your doctor. This is important. Contact a doctor if you:  Have pain and swelling that do not get better with treatment or medicine.  Have trouble pooping.  Cannot poop.  Have pain or swelling outside the area of the hemorrhoids. Get help right away if you have:  Bleeding that will not stop. Summary  Hemorrhoids are swollen veins in the butt or around the opening of the butt.   They can cause pain, itching, or bleeding.  Eat foods that have a lot of fiber in them. These include whole grains, beans, nuts, fruits, and vegetables.  Take a warm-water bath (sitz bath) for 20 minutes to ease pain. Do this 3-4 times a day. This information is not intended to replace advice given to you by your health care provider. Make sure you discuss any questions you have with your health care provider. Document Released: 11/19/2007 Document Revised: 02/17/2018 Document Reviewed: 07/01/2017 Elsevier Patient Education  2020 [employer]  

## 2018-11-05 ENCOUNTER — Encounter: Payer: Self-pay | Admitting: Osteopathic Medicine

## 2018-11-09 ENCOUNTER — Ambulatory Visit: Payer: No Typology Code available for payment source | Admitting: Osteopathic Medicine

## 2018-11-18 ENCOUNTER — Other Ambulatory Visit: Payer: Self-pay

## 2018-11-21 NOTE — Progress Notes (Signed)
33 y.o. [id number] Married [place] female here for annual exam.    Seen 10/27/18 for irregular menses with periods occurring every 31 to 90 days.  She had spotting for about 3 weeks.  She had leg cramps and abdominal pain with Micronor. She took a course of Provera x 10 days and started this on 10/27/18. Her bleeding ended on 11/14/18. Her menses started 11/21/18 with spotting.   She wants routine labs today.  She has been working on changing her diet due to her elevated cholesterol. Dizziness with standing.   PCP:  Emeterio Reeve, DO   Patient's last menstrual period was 11/21/2018 (exact date).     Period Duration (Days): different each cycle Period Pattern: (!) Irregular Menstrual Flow: Light(light at times--this month after taking Provera bleeding heavily for weeks) Menstrual Control: Maxi pad Dysmenorrhea: (!) Mild Dysmenorrhea Symptoms: Cramping, Headache     Sexually active: Yes.    The current method of family planning is none.    Exercising: Yes.    walks 1 hr/day and weights Smoker:  no  Health Maintenance: Pap: 08-04-17 Neg:Neg HR HPV  History of abnormal Pap:  no MMG:  n/a Colonoscopy:  n/a BMD:   n/a  Result  n/a TDaP: 2017 Gardasil:   Unsure HIV:07-23-17 Neg Hep C:07-23-17 Neg Screening Labs:   -- Flu vaccine:  Done.    reports that she is a non-smoker but has been exposed to tobacco smoke. She has never used smokeless tobacco. She reports previous alcohol use. She reports that she does not use drugs.  Past Medical History:  Diagnosis Date  . Elevated cholesterol 2019  . History of PCOS   . Migraine    with aura    History reviewed. No pertinent surgical history.  No current outpatient medications on file.   No current facility-administered medications for this visit.     Family History  Problem Relation Age of Onset  . Arthritis Father   . Polycystic ovary syndrome Sister   . Polycystic ovary syndrome Sister   . Goiter Sister   . Thyroid disease  Sister     Review of Systems  All other systems reviewed and are negative.   Exam:   BP 104/66   Pulse 64   Temp 97.6 F (36.4 C) (Temporal)   Resp 18   Ht 5' 3.5" (1.613 m)   Wt 133 lb (60.3 kg)   LMP 11/21/2018 (Exact Date)   BMI 23.19 kg/m     General appearance: alert, cooperative and appears stated age Head: normocephalic, without obvious abnormality, atraumatic Neck: no adenopathy, supple, symmetrical, trachea midline and thyroid normal to inspection and palpation Lungs: clear to auscultation bilaterally Breasts: normal appearance, no masses or tenderness, No nipple retraction or dimpling, No nipple discharge or bleeding, No axillary adenopathy Heart: regular rate and rhythm Abdomen: soft, non-tender; no masses, no organomegaly Extremities: extremities normal, atraumatic, no cyanosis or edema Skin: skin color, texture, turgor normal. No rashes or lesions Lymph nodes: cervical, supraclavicular, and axillary nodes normal. Neurologic: grossly normal  Pelvic: External genitalia:  no lesions              No abnormal inguinal nodes palpated.              Urethra:  normal appearing urethra with no masses, tenderness or lesions              Bartholins and Skenes: normal  Vagina: normal appearing vagina with normal color and discharge, no lesions              Cervix: no lesions.  Menstrual flow noted.               Pap taken: No. Bimanual Exam:  Uterus:  normal size, contour, position, consistency, mobility, non-tender              Adnexa: no mass, fullness, tenderness          Chaperone was present for exam.  Assessment:   Well woman visit with normal exam. Irregular menses.  Hx PCOS.  Normal TSH and prolactin.   Intolerant of Micronor.  Migraine with aura. Dizziness.   Plan: Mammogram screening discussed. Self breast awareness reviewed. Pap and HR HPV as above. Guidelines for Calcium, Vitamin D, regular exercise program including cardiovascular and  weight bearing exercise. We discussed PCOS and increase risk of increased cholesterol, diabetes, and endometrial hyperplasia/endometrial cancer.  Routine labs including A1C due to her PCOS status.  Return for pelvic US and possible EMB. She will abstain for 2 weeks prior to this.  She will start Gardasil series today.  PNV. Follow up annually and prn.   After visit summary provided.

## 2018-11-22 ENCOUNTER — Other Ambulatory Visit: Payer: Self-pay

## 2018-11-22 ENCOUNTER — Ambulatory Visit (INDEPENDENT_AMBULATORY_CARE_PROVIDER_SITE_OTHER): Payer: No Typology Code available for payment source | Admitting: Obstetrics and Gynecology

## 2018-11-22 ENCOUNTER — Ambulatory Visit: Payer: No Typology Code available for payment source | Admitting: Obstetrics and Gynecology

## 2018-11-22 ENCOUNTER — Encounter: Payer: Self-pay | Admitting: Obstetrics and Gynecology

## 2018-11-22 VITALS — BP 104/66 | HR 64 | Temp 97.6°F | Resp 18 | Ht 63.5 in | Wt 133.0 lb

## 2018-11-22 DIAGNOSIS — N926 Irregular menstruation, unspecified: Secondary | ICD-10-CM

## 2018-11-22 DIAGNOSIS — E282 Polycystic ovarian syndrome: Secondary | ICD-10-CM | POA: Diagnosis not present

## 2018-11-22 DIAGNOSIS — Z23 Encounter for immunization: Secondary | ICD-10-CM | POA: Diagnosis not present

## 2018-11-22 DIAGNOSIS — Z01419 Encounter for gynecological examination (general) (routine) without abnormal findings: Secondary | ICD-10-CM | POA: Diagnosis not present

## 2018-11-22 NOTE — Patient Instructions (Addendum)
EXERCISE AND DIET:  We recommended that you start or continue a regular exercise program for good health. Regular exercise means any activity that makes your heart beat faster and makes you sweat.  We recommend exercising at least 30 minutes per day at least 3 days a week, preferably 4 or 5.  We also recommend a diet low in fat and sugar.  Inactivity, poor dietary choices and obesity can cause diabetes, heart attack, stroke, and kidney damage, among others.    ALCOHOL AND SMOKING:  Women should limit their alcohol intake to no more than 7 drinks/beers/glasses of wine (combined, not each!) per week. Moderation of alcohol intake to this level decreases your risk of breast cancer and liver damage. And of course, no recreational drugs are part of a healthy lifestyle.  And absolutely no smoking or even second hand smoke. Most people know smoking can cause heart and lung diseases, but did you know it also contributes to weakening of your bones? Aging of your skin?  Yellowing of your teeth and nails?  CALCIUM AND VITAMIN D:  Adequate intake of calcium and Vitamin D are recommended.  The recommendations for exact amounts of these supplements seem to change often, but generally speaking 600 mg of calcium (either carbonate or citrate) and 800 units of Vitamin D per day seems prudent. Certain women may benefit from higher intake of Vitamin D.  If you are among these women, your doctor will have told you during your visit.    PAP SMEARS:  Pap smears, to check for cervical cancer or precancers,  have traditionally been done yearly, although recent scientific advances have shown that most women can have pap smears less often.  However, every woman still should have a physical exam from her gynecologist every year. It will include a breast check, inspection of the vulva and vagina to check for abnormal growths or skin changes, a visual exam of the cervix, and then an exam to evaluate the size and shape of the uterus and  ovaries.  And after 32 years of age, a rectal exam is indicated to check for rectal cancers. We will also provide age appropriate advice regarding health maintenance, like when you should have certain vaccines, screening for sexually transmitted diseases, bone density testing, colonoscopy, mammograms, etc.   MAMMOGRAMS:  All women over 40 years old should have a yearly mammogram. Many facilities now offer a "3D" mammogram, which may cost around $50 extra out of pocket. If possible,  we recommend you accept the option to have the 3D mammogram performed.  It both reduces the number of women who will be called back for extra views which then turn out to be normal, and it is better than the routine mammogram at detecting truly abnormal areas.    COLONOSCOPY:  Colonoscopy to screen for colon cancer is recommended for all women at age 50.  We know, you hate the idea of the prep.  We agree, BUT, having colon cancer and not knowing it is worse!!  Colon cancer so often starts as a polyp that can be seen and removed at colonscopy, which can quite literally save your life!  And if your first colonoscopy is normal and you have no family history of colon cancer, most women don't have to have it again for 10 years.  Once every ten years, you can do something that may end up saving your life, right?  We will be happy to help you get it scheduled when you are ready.    Be sure to check your insurance coverage so you understand how much it will cost.  It may be covered as a preventative service at no cost, but you should check your particular policy.      Polycystic Ovarian Syndrome  Polycystic ovarian syndrome (PCOS) is a common hormonal disorder among women of reproductive age. In most women with PCOS, many small fluid-filled sacs (cysts) grow on the ovaries, and the cysts are not part of a normal menstrual cycle. PCOS can cause problems with your menstrual periods and make it difficult to get pregnant. It can also cause  an increased risk of miscarriage with pregnancy. If it is not treated, PCOS can lead to serious health problems, such as diabetes and heart disease. What are the causes? The cause of PCOS is not known, but it may be the result of a combination of certain factors, such as:  Irregular menstrual cycle.  High levels of certain hormones (androgens).  Problems with the hormone that helps to control blood sugar (insulin resistance).  Certain genes. What increases the risk? This condition is more likely to develop in women who have a family history of PCOS. What are the signs or symptoms? Symptoms of PCOS may include:  Multiple ovarian cysts.  Infrequent periods or no periods.  Periods that are too frequent or too heavy.  Unpredictable periods.  Inability to get pregnant (infertility) because of not ovulating.  Increased growth of hair on the face, chest, stomach, back, thumbs, thighs, or toes.  Acne or oily skin. Acne may develop during adulthood, and it may not respond to treatment.  Pelvic pain.  Weight gain or obesity.  Patches of thickened and dark brown or black skin on the neck, arms, breasts, or thighs (acanthosis nigricans).  Excess hair growth on the face, chest, abdomen, or upper thighs (hirsutism). How is this diagnosed? This condition is diagnosed based on:  Your medical history.  A physical exam, including a pelvic exam. Your health care provider may look for areas of increased hair growth on your skin.  Tests, such as: ? Ultrasound. This may be used to examine the ovaries and the lining of the uterus (endometrium) for cysts. ? Blood tests. These may be used to check levels of sugar (glucose), female hormone (testosterone), and female hormones (estrogen and progesterone) in your blood. How is this treated? There is no cure for PCOS, but treatment can help to manage symptoms and prevent more health problems from developing. Treatment varies depending on:  Your  symptoms.  Whether you want to have a baby or whether you need birth control (contraception). Treatment may include nutrition and lifestyle changes along with:  Progesterone hormone to start a menstrual period.  Birth control pills to help you have regular menstrual periods.  Medicines to make you ovulate, if you want to get pregnant.  Medicine to reduce excessive hair growth.  Surgery, in severe cases. This may involve making small holes in one or both of your ovaries. This decreases the amount of testosterone that your body produces. Follow these instructions at home:  Take over-the-counter and prescription medicines only as told by your health care provider.  Follow a healthy meal plan. This can help you reduce the effects of PCOS. ? Eat a healthy diet that includes lean proteins, complex carbohydrates, fresh fruits and vegetables, low-fat dairy products, and healthy fats. Make sure to eat enough fiber.  If you are overweight, lose weight as told by your health care provider. ? Losing 10% of  your body weight may improve symptoms. ? Your health care provider can determine how much weight loss is best for you and can help you lose weight safely.  Keep all follow-up visits as told by your health care provider. This is important. Contact a health care provider if:  Your symptoms do not get better with medicine.  You develop new symptoms. This information is not intended to replace advice given to you by your health care provider. Make sure you discuss any questions you have with your health care provider. Document Released: 06/05/2004 Document Revised: 01/22/2017 Document Reviewed: 07/28/2015 Elsevier Patient Education  2020 [employer].  Endometrial Hyperplasia  Endometrial hyperplasia is abnormal thickening of the tissue that lines the inside of the uterus (endometrium). This condition can also cause cell changes (atypia) that can lead to endometrial cancer. There are four  types of endometrial hyperplasia:  Endometrial thickening only (simple hyperplasia).  Endometrial thickening and crowding of cells (complex hyperplasia).  Endometrial thickening and cell changes (simple atypical hyperplasia). This type has a low risk of becoming cancerous.  Endometrial thickening, cell crowding, and cell changes (complex atypical hyperplasia). This type has a higher risk of becoming cancerous. Early diagnosis and treatment are important to reduce the risk of cancer. What are the causes? This condition is caused by an imbalance of the reproductive hormones estrogen and progesterone. At the beginning of your menstrual cycle, your ovaries make estrogen. This thickens the lining of your uterus to prepare for pregnancy. If pregnancy does not occur, your estrogen level drops. Then, the hormone progesterone prompts your body to shed the lining of your uterus. This is your menstrual period. Endometrial hyperplasia results from too much estrogen and not enough progesterone. What increases the risk? The following factors may make you more likely to develop this condition:  Being older than 32 years of age. The condition is most common just before or after menopause. Menopause means 12 months without a menstrual period.  Taking an estrogen medicine or a medicine that acts like estrogen.  Having polycystic ovary syndrome (PCOS).  Being overweight.  Smoking.  Having diabetes.  Having irregular periods.  Having started periods early.  Having started menopause late.  Never having been pregnant.  Having a family history of uterine, ovarian, or colon cancer.  Having other diseases such as gallbladder or thyroid disease. What are the signs or symptoms? The main symptom of this condition is abnormal vaginal bleeding. The bleeding may:  Be heavier and longer during menstrual periods.  Happen more often than a normal menstrual cycle.  Occur after menopause. How is this  diagnosed? This condition may be diagnosed based on:  Your symptoms and medical history, including risk factors.  A physical exam.  Tests, such as: ? An imaging study done with sound waves (transvaginal ultrasound) to check the endometrium. This test uses a sound wave probe that is inserted into your vagina. ? A procedure to take a sample of endometrial tissue through the vagina so it can be looked at under a microscope (endometrial biopsy). ? A procedure in which tissue is scraped or suctioned from the inside of the uterus (dilation and curettage). ? A procedure in which a thin, lighted device is inserted into the uterus through the cervix to see inside the uterus (hysteroscopy). How is this treated? Treatment for this condition depends on the type of hyperplasia that you have. Synthetic progesterone (progestin) is the most common treatment. Progestin can be given as a pill, injection, or vaginal cream,  or as a device that is implanted in your uterus (intrauterine device, IUD). If you have hyperplasia with atypia, you may need surgery to remove your uterus (hysterectomy). You may be more likely to have this procedure if:  You have complex atypical hyperplasia.  You are past menopause.  You do not want to become pregnant. Follow these instructions at home:   Take over-the-counter and prescription medicines only as told by your health care provider.  Do not use any products that contain nicotine or tobacco, such as cigarettes and e-cigarettes. If you need help quitting, ask your health care provider.  Lose weight if you are overweight. Ask your health care provider to recommend a diet and exercise program to help you reach and maintain a healthy weight.  Keep all follow-up visits as told by your health care provider. This is important. Contact a health care provider if:  You have periods that are heavier or last longer than usual.  You have your period more often than usual.  You  have vaginal bleeding after menopause. Get help right away if:  You have vaginal bleeding that is so heavy that you need to change your tampon or sanitary napkin after less than 2 hours.  You pass blood clots that are larger than the size of a quarter. Summary  Endometrial hyperplasia is abnormal thickening of the tissue that lines the inside of the uterus (endometrium).  The main symptom of this condition is abnormal vaginal bleeding. This may be heavier, longer, or more frequent periods, or it may be vaginal bleeding after menopause.  Some types of hyperplasia also cause cell changes (atypia) that can lead to cancer.  Always let your health care provider know about abnormal vaginal bleeding.  Early diagnosis and treatment are important to reduce the risk of cancer. This information is not intended to replace advice given to you by your health care provider. Make sure you discuss any questions you have with your health care provider. Document Released: 11/22/2015 Document Revised: 11/22/2015 Document Reviewed: 11/22/2015 Elsevier Patient Education  2020 [employer].

## 2018-11-23 ENCOUNTER — Encounter: Payer: Self-pay | Admitting: Osteopathic Medicine

## 2018-11-23 DIAGNOSIS — E782 Mixed hyperlipidemia: Secondary | ICD-10-CM

## 2018-11-23 LAB — CBC
Hematocrit: 36.2 % (ref 34.0–46.6)
Hemoglobin: 11.9 g/dL (ref 11.1–15.9)
MCH: 28.3 pg (ref 26.6–33.0)
MCHC: 32.9 g/dL (ref 31.5–35.7)
MCV: 86 fL (ref 79–97)
Platelets: 422 10*3/uL (ref 150–450)
RBC: 4.2 x10E6/uL (ref 3.77–5.28)
RDW: 13.1 % (ref 11.7–15.4)
WBC: 7.7 10*3/uL (ref 3.4–10.8)

## 2018-11-23 LAB — COMPREHENSIVE METABOLIC PANEL
ALT: 12 IU/L (ref 0–32)
AST: 19 IU/L (ref 0–40)
Albumin/Globulin Ratio: 1.5 (ref 1.2–2.2)
Albumin: 4.8 g/dL (ref 3.8–4.8)
Alkaline Phosphatase: 81 IU/L (ref 39–117)
BUN/Creatinine Ratio: 15 (ref 9–23)
BUN: 11 mg/dL (ref 6–20)
Bilirubin Total: 0.7 mg/dL (ref 0.0–1.2)
CO2: 18 mmol/L — ABNORMAL LOW (ref 20–29)
Calcium: 9.7 mg/dL (ref 8.7–10.2)
Chloride: 101 mmol/L (ref 96–106)
Creatinine, Ser: 0.73 mg/dL (ref 0.57–1.00)
GFR calc Af Amer: 126 mL/min/{1.73_m2} (ref >59)
GFR calc non Af Amer: 109 mL/min/{1.73_m2} (ref >59)
Globulin, Total: 3.1 g/dL (ref 1.5–4.5)
Glucose: 68 mg/dL (ref 65–99)
Potassium: 4.1 mmol/L (ref 3.5–5.2)
Sodium: 139 mmol/L (ref 134–144)
Total Protein: 7.9 g/dL (ref 6.0–8.5)

## 2018-11-23 LAB — LIPID PANEL
Chol/HDL Ratio: 5.2 ratio — ABNORMAL HIGH (ref 0.0–4.4)
Cholesterol, Total: 268 mg/dL — ABNORMAL HIGH (ref 100–199)
HDL: 52 mg/dL (ref >39)
LDL Chol Calc (NIH): 199 mg/dL — ABNORMAL HIGH (ref 0–99)
Triglycerides: 96 mg/dL (ref 0–149)
VLDL Cholesterol Cal: 17 mg/dL (ref 5–40)

## 2018-11-23 LAB — HEMOGLOBIN A1C
Est. average glucose Bld gHb Est-mCnc: 94 mg/dL
Hgb A1c MFr Bld: 4.9 % (ref 4.8–5.6)

## 2018-11-23 LAB — TSH: TSH: 2.84 u[IU]/mL (ref 0.450–4.500)

## 2018-11-23 MED ORDER — ROSUVASTATIN CALCIUM 40 MG PO TABS: 40.0000 mg | ORAL_TABLET | Freq: Every day | ORAL | 0 refills | Status: DC

## 2018-11-24 ENCOUNTER — Ambulatory Visit (INDEPENDENT_AMBULATORY_CARE_PROVIDER_SITE_OTHER): Payer: No Typology Code available for payment source

## 2018-11-24 ENCOUNTER — Ambulatory Visit (INDEPENDENT_AMBULATORY_CARE_PROVIDER_SITE_OTHER): Payer: No Typology Code available for payment source | Admitting: Obstetrics and Gynecology

## 2018-11-24 ENCOUNTER — Encounter: Payer: Self-pay | Admitting: Obstetrics and Gynecology

## 2018-11-24 ENCOUNTER — Other Ambulatory Visit: Payer: Self-pay

## 2018-11-24 VITALS — BP 118/70 | HR 76 | Temp 97.8°F | Ht 63.25 in | Wt 133.0 lb

## 2018-11-24 DIAGNOSIS — E282 Polycystic ovarian syndrome: Secondary | ICD-10-CM | POA: Diagnosis not present

## 2018-11-24 DIAGNOSIS — N926 Irregular menstruation, unspecified: Secondary | ICD-10-CM | POA: Diagnosis not present

## 2018-11-24 DIAGNOSIS — D252 Subserosal leiomyoma of uterus: Secondary | ICD-10-CM

## 2018-11-24 DIAGNOSIS — N970 Female infertility associated with anovulation: Secondary | ICD-10-CM | POA: Diagnosis not present

## 2018-11-24 MED ORDER — MEDROXYPROGESTERONE ACETATE 10 MG PO TABS: 10.0000 mg | ORAL_TABLET | Freq: Every day | ORAL | 1 refills | Status: DC

## 2018-11-24 NOTE — Patient Instructions (Signed)

## 2018-11-24 NOTE — Progress Notes (Signed)
Encounter reviewed by Dr. Brook Amundson C. Silva.  

## 2018-11-24 NOTE — Progress Notes (Signed)
GYNECOLOGY  VISIT   HPI: 33 y.o.   Married  Asian  female   Julia Carter with Patient's last menstrual period was 11/21/2018 (exact date).   here for pelvic ultrasound.    She has PCOS and irregular cycles.  Menses every 31 - 90 days. She took a course of Provera 10 mg x 10 days for prolonged vaginal bleeding.  She started on 10/26/18. Her bleeding ended on 11/14/18.  She started bleeding with her withdrawal on 11/21/18.  Leg cramps and abdominal pain with Micronor.   She had a normal prolactin and TSH.  LH>> FSH.  No testosterone level on chart review.  She states some nipple hair but no facial or chest hair.   She has migraine with aura.  Just started medication to control her cholesterol, Crestor.  She followed a low carb diet and has lost weight doing this.   GYNECOLOGIC HISTORY: Patient's last menstrual period was 11/21/2018 (exact date). Contraception: none Menopausal hormone therapy:  n/a Last mammogram:  n/a Last pap smear:  08-04-17 Neg:Neg HR HPV         OB History    Gravida  1   Para  1   Term  0   Preterm  0   AB  0   Living  1     SAB  0   TAB  0   Ectopic  0   Multiple  0   Live Births  0              Patient Active Problem List   Diagnosis Date Noted  . Rupture of left tympanic membrane 08/03/2018  . PCOS (polycystic ovarian syndrome) 08/03/2018  . Mixed hyperlipidemia 08/03/2018  . Family history of thyroid disease 08/03/2018  . Irregular menses 07/23/2017    Past Medical History:  Diagnosis Date  . Elevated cholesterol 2019  . History of PCOS   . Migraine    with aura    No past surgical history on file.  Current Outpatient Medications  Medication Sig Dispense Refill  . rosuvastatin (CRESTOR) 40 MG tablet Take 1 tablet (40 mg total) by mouth daily. 90 tablet 0   No current facility-administered medications for this visit.      ALLERGIES: Norethindrone and Other  Family History  Problem Relation Age of Onset  .  Arthritis Father   . Polycystic ovary syndrome Sister   . Polycystic ovary syndrome Sister   . Goiter Sister   . Thyroid disease Sister     Social History   Socioeconomic History  . Marital status: Married    Spouse name: Not on file  . Number of children: 1  . Years of education: Not on file  . Highest education level: Not on file  Occupational History  . Occupation: [employer] Loss adjuster, chartered: [employer]  Social Needs  . Financial resource strain: Not on file  . Food insecurity    Worry: Not on file    Inability: Not on file  . Transportation needs    Medical: Not on file    Non-medical: Not on file  Tobacco Use  . Smoking status: Passive Smoke Exposure - Never Smoker  . Smokeless tobacco: Never Used  Substance and Sexual Activity  . Alcohol use: Not Currently  . Drug use: Never  . Sexual activity: Yes    Partners: Male    Birth control/protection: None    Comment: trying for pregnancy  Lifestyle  . Physical activity  Days per week: Not on file    Minutes per session: Not on file  . Stress: Not on file  Relationships  . Social Herbalist on phone: Not on file    Gets together: Not on file    Attends religious service: Not on file    Active member of club or organization: Not on file    Attends meetings of clubs or organizations: Not on file    Relationship status: Not on file  . Intimate partner violence    Fear of current or ex partner: Not on file    Emotionally abused: Not on file    Physically abused: Not on file    Forced sexual activity: Not on file  Other Topics Concern  . Not on file  Social History Narrative  . Not on file    Review of Systems  All other systems reviewed and are negative.   PHYSICAL EXAMINATION:    BP 118/70   Pulse 76   Temp 97.8 F (36.6 C) (Temporal)   Ht 5' 3.25" (1.607 m)   Wt 133 lb (60.3 kg)   LMP 11/21/2018 (Exact Date)   BMI 23.37 kg/m     General appearance: alert, cooperative and appears stated  age  Pelvic US  Uterus with subserosal fibroid, 1.79 mm anterior. EMS 2.61 mm. Ovaries normal, multiple follicles bilaterally.  No free fluid.  ASSESSMENT  Fibroid.  PCOS.  Elevated cholesterol.  On Crestor.  Migraine with aura.  Did will with Provera for control of menstruation.  Intolerant of Micronor which caused leg cramps.  PLAN  We reviewed fibroids - signs and symptoms, life history.  Provera 10 mg x 10 days per month if no spontaneous cycle. #30, RF one.   She knows to do a pregnancy test prior to taking this medication every month.  Prevent pregnancy while on Crestor.  We discussed PCOS.  Will check testosterone level today.  She is following a Mediterranean diet.  Fu here in 3 months.    An After Visit Summary was printed and given to the patient.  __25____ minutes face to face time of which over 50% was spent in counseling.

## 2018-11-28 ENCOUNTER — Encounter: Payer: Self-pay | Admitting: Osteopathic Medicine

## 2018-11-29 ENCOUNTER — Telehealth: Payer: Self-pay

## 2018-11-29 NOTE — Telephone Encounter (Signed)
Can try stopping the medicine for 1-2 weeks and see if this improves symptoms. I cannot say for certain that the medication is the cause of symptoms, if problems persist after a week or two off the medicine she should schedule a visit to discuss

## 2018-11-29 NOTE — Telephone Encounter (Signed)
Pt has been updated of provider's recommendation. Pt was agreeable. She will stopped taking Crestor. She is aware to make an appt with provider if symptoms continue or worsen. No other inquiries during call.

## 2018-11-29 NOTE — Telephone Encounter (Addendum)
Pt called stating that she notice since taking Crestor rx she gets tired easily. Also has complaints of pain in her knees. Pt wants to know should she stop taking the rx. Requesting feed back from provider. Pls advise, thanks.

## 2018-12-01 LAB — TESTOSTERONE, FREE, DIRECT
Testosterone, Free: 3.1 pg/mL (ref 0.0–4.2)
Testosterone, Total, LC/MS: 34.2 ng/dL (ref 10.0–55.0)

## 2018-12-02 ENCOUNTER — Encounter: Payer: Self-pay | Admitting: Osteopathic Medicine

## 2018-12-04 ENCOUNTER — Encounter: Payer: Self-pay | Admitting: Obstetrics and Gynecology

## 2018-12-06 ENCOUNTER — Encounter: Payer: Self-pay | Admitting: Osteopathic Medicine

## 2019-01-04 ENCOUNTER — Telehealth: Payer: Self-pay | Admitting: Obstetrics and Gynecology

## 2019-01-04 ENCOUNTER — Encounter: Payer: Self-pay | Admitting: Obstetrics and Gynecology

## 2019-01-04 NOTE — Telephone Encounter (Signed)
Spoke with pt. Pt states starting new cycle on 01/01/19 based on spotting and cramping and ending provera on 12/31/18.  Pt wants to get 2nd gardisil injection scheduled. Does she have to wait while having cycle or can have at anytime?   Routing to Dr Quincy Simmonds for recommendations.

## 2019-01-04 NOTE — Telephone Encounter (Signed)
Patient sent the following message through [place]. Routing to triage to assist patient with request.  Julia, Carter [facility]  Phone Number: [phone]        Good morning Dr Quincy Simmonds I finished taking provera last Saturday and now I am cramping and spotting. I remember I was told if I will have period this month to let you know so I can have another vaccine?

## 2019-01-05 NOTE — Telephone Encounter (Signed)
Spoke with patient. 1st gardasil injection received 11/22/18, 2nd due on or after 01/22/19. Menses started today. Advised per Dr. Quincy Simmonds. Patient verbalizes understanding, she will return call to office to schedule nurse visit for gardasil vaccine at later date.   Routing to provider for final review. Patient is agreeable to disposition. Will close encounter.

## 2019-01-05 NOTE — Telephone Encounter (Signed)
Patient needs to adhere to the recommended protocol.  The second vaccine is to be given 2 months after the first injection.  If she is not on her period at the 2 month mark and if there is a chance of pregnancy, we can do a pregnancy test in the office before she receives the injection.

## 2019-02-28 ENCOUNTER — Other Ambulatory Visit: Payer: Self-pay

## 2019-02-28 ENCOUNTER — Encounter: Payer: Self-pay | Admitting: Obstetrics and Gynecology

## 2019-02-28 ENCOUNTER — Ambulatory Visit (INDEPENDENT_AMBULATORY_CARE_PROVIDER_SITE_OTHER): Payer: No Typology Code available for payment source | Admitting: Obstetrics and Gynecology

## 2019-02-28 VITALS — BP 120/72 | Temp 96.8°F | Ht 63.25 in | Wt 145.0 lb

## 2019-02-28 DIAGNOSIS — Z3169 Encounter for other general counseling and advice on procreation: Secondary | ICD-10-CM | POA: Diagnosis not present

## 2019-02-28 DIAGNOSIS — N926 Irregular menstruation, unspecified: Secondary | ICD-10-CM | POA: Diagnosis not present

## 2019-02-28 LAB — POCT URINE PREGNANCY: Preg Test, Ur: NEGATIVE

## 2019-02-28 MED ORDER — MEDROXYPROGESTERONE ACETATE 5 MG PO TABS: 5.0000 mg | ORAL_TABLET | Freq: Every day | ORAL | 0 refills | Status: AC

## 2019-02-28 NOTE — Progress Notes (Signed)
GYNECOLOGY  VISIT   HPI: 34 y.o.   Married  Asian  female   Damon with Patient's last menstrual period was 02/01/2019 (exact date).   here for 3 month follow up.   Took Provera the end of November. She had a withdrawal bleed following this.  She has not taken Provera since then.  She has irregular menses with cycle every 31 - 90 days if she does not take Provera. Her TSH and prolactin are normal.  LH>>FSH. She has a normal testosterone level.   She has one small fibroid.  She has migraine with aura.  Does not tolerate Micronor due to leg cramps.  She and her husband are now interested to have a pregnancy.  They are currently trying.  Last intercourse was about 02/24/19. They want to try without ovulation induction for now.  Had her flu vaccine.  She is avoiding medication exposures, ETOH, tobacco.  Does not change the litter box. No FH of congenital anomalies or cystic fibrosis.   UPT:Neg (pt.requested)  GYNECOLOGIC HISTORY: Patient's last menstrual period was 02/01/2019 (exact date). Contraception:  none Menopausal hormone therapy:  n/a Last mammogram:  n/a Last pap smear:  08-04-17 Neg:Neg HR HPV        OB History    Gravida  1   Para  1   Term  0   Preterm  0   AB  0   Living  1     SAB  0   TAB  0   Ectopic  0   Multiple  0   Live Births  0              Patient Active Problem List   Diagnosis Date Noted  . Rupture of left tympanic membrane 08/03/2018  . PCOS (polycystic ovarian syndrome) 08/03/2018  . Mixed hyperlipidemia 08/03/2018  . Family history of thyroid disease 08/03/2018  . Irregular menses 07/23/2017    Past Medical History:  Diagnosis Date  . Elevated cholesterol 2019  . History of PCOS   . Migraine    with aura    History reviewed. No pertinent surgical history.  Current Outpatient Medications  Medication Sig Dispense Refill  . medroxyPROGESTERone (PROVERA) 10 MG tablet Take 1 tablet (10 mg total) by mouth  daily. Take for 10 days every month if you do not have a spontaneous menstruation. 30 tablet 1   No current facility-administered medications for this visit.     ALLERGIES: Norethindrone and Other  Family History  Problem Relation Age of Onset  . Arthritis Father   . Polycystic ovary syndrome Sister   . Polycystic ovary syndrome Sister   . Goiter Sister   . Thyroid disease Sister     Social History   Socioeconomic History  . Marital status: Married    Spouse name: Not on file  . Number of children: 1  . Years of education: Not on file  . Highest education level: Not on file  Occupational History  . Occupation: AIM Loss adjuster, chartered: [employer]  Tobacco Use  . Smoking status: Passive Smoke Exposure - Never Smoker  . Smokeless tobacco: Never Used  Substance and Sexual Activity  . Alcohol use: Not Currently  . Drug use: Never  . Sexual activity: Yes    Partners: Male    Birth control/protection: None    Comment: trying for pregnancy  Other Topics Concern  . Not on file  Social History Narrative  . Not on file  Social Determinants of Health   Financial Resource Strain:   . Difficulty of Paying Living Expenses: Not on file  Food Insecurity:   . Worried About Charity fundraiser in the Last Year: Not on file  . Ran Out of Food in the Last Year: Not on file  Transportation Needs:   . Lack of Transportation (Medical): Not on file  . Lack of Transportation (Non-Medical): Not on file  Physical Activity:   . Days of Exercise per Week: Not on file  . Minutes of Exercise per Session: Not on file  Stress:   . Feeling of Stress : Not on file  Social Connections:   . Frequency of Communication with Friends and Family: Not on file  . Frequency of Social Gatherings with Friends and Family: Not on file  . Attends Religious Services: Not on file  . Active Member of Clubs or Organizations: Not on file  . Attends Archivist Meetings: Not on file  . Marital Status:  Not on file  Intimate Partner Violence:   . Fear of Current or Ex-Partner: Not on file  . Emotionally Abused: Not on file  . Physically Abused: Not on file  . Sexually Abused: Not on file    Review of Systems  All other systems reviewed and are negative.   PHYSICAL EXAMINATION:    BP 120/72   Temp (!) 96.8 F (36 C) (Temporal)   Ht 5' 3.25" (1.607 m)   Wt 145 lb (65.8 kg)   LMP 02/01/2019 (Exact Date)   BMI 25.48 kg/m     General appearance: alert, cooperative and appears stated age  ASSESSMENT  Irregular menses.  Desire for fertility.   PLAN  Preconception counseling.  Continue PNV.  She will repeat her pregnancy test on 03/10/19, and if negative, she will then take Provera 5 mg x 5 days.  We discussed timed intercourse.  If no menses by day 36, do UPT and if negative, start Provera and timed intercourse again.  If no pregnancy in 6 months, return for discussion of ovulation induction with Clomid or Letrozole.    An After Visit Summary was printed and given to the patient.  __25____ minutes face to face time of which over 50% was spent in counseling.

## 2019-03-02 ENCOUNTER — Encounter: Payer: Self-pay | Admitting: Osteopathic Medicine

## 2019-03-06 ENCOUNTER — Encounter: Payer: Self-pay | Admitting: Nurse Practitioner

## 2019-03-06 ENCOUNTER — Ambulatory Visit (INDEPENDENT_AMBULATORY_CARE_PROVIDER_SITE_OTHER): Payer: No Typology Code available for payment source | Admitting: Nurse Practitioner

## 2019-03-06 VITALS — Wt 145.0 lb

## 2019-03-06 DIAGNOSIS — R399 Unspecified symptoms and signs involving the genitourinary system: Secondary | ICD-10-CM | POA: Diagnosis not present

## 2019-03-06 NOTE — Progress Notes (Addendum)
Virtual Visit via Video Note  I connected with Julia Carter on 03/06/19 at  3:00 PM EST by the video enabled telemedicine application, D[employer]ity, and verified that I am speaking with the correct person using two identifiers.   I discussed the limitations of evaluation and management by telemedicine and the availability of in person appointments. The patient expressed understanding and agreed to proceed.  Subjective:    CC: Abdominal cramping and leg pain  HPI: Julia Carter is a 34 y.o. y/o female presenting via [place] today for lower abdominal and lower back pain and cramping going down into the legs starting Monday, 02/27/2019. She reports the pain heightened on Thursday to 7/10 and then slowly started improving. She does not have any pain or symptoms today. She states that the pain was felt strongest in her suprapubic area and low back. She did experience dysuria and frequency at that time, but those have also subsided. She reports the pain to feel like very strong menstrual cramps.   She is trying to get pregnant and took a pregnancy test on Friday, which was negative. Her LMP was 02/02/2019 and she is expecting to begin menstruation any day. She denies any spotting, discharge, or vaginal odor. She denies passing any kidney stone and reports her urine looks normal.   Duration:one week Onset: gradual Severity: 7/10 at it's worst Quality: cramping Location: lower abdomen, lower back, and into legs Episode duration: all day Radiation: yes lower abdomen and lower back to legs Frequency: constant at first then worsened now slowly improving to no pain today Alleviating factors: Advil  Aggravating factors: nothing Status: better Treatments attempted: Advil, laying on stomach, warm towel, massage Fever: no Nausea: no Vomiting: no Weight loss: no Decreased appetite: no Diarrhea: no Constipation: no Blood in stool: no Heartburn: no Dysuria/urinary frequency: yes   Suprapubic pain: yes Hematuria: no History of sexually transmitted disease: no Recurrent NSAID use: no  Past medical history, Surgical history, Family history not pertinant except as noted below, Social history, Allergies, and medications have been entered into the medical record, reviewed, and corrections made.   Review of Systems:  General: No fevers, chills, fatigue, night sweats, weight loss.   Neuro: No headache, numbness, paresthesias, or change in mental status  Pulmonary/CV: No cough, shortness of breath, chest pain, edema, palpitations, syncope GI: No nausea, vomiting, changes in bowel habits, anorexia  GU: No hematuria, urinary incontinence, no    Objective:    General: Speaking clearly in complete sentences without any shortness of breath.  Alert and oriented x3.  Normal judgment. No apparent acute distress.   Impression and Recommendations:    1. UTI symptoms It appears as though she was experiencing symptoms of a UTI that have resolved on their own. We discussed if the symptoms return I would like to see her in the office for a urine test. I gave her information on pyridium OTC for symptom management until she could be seen, if this happens again. She had a negative pregnancy test at home on Friday, therefore it is unlikely that this was the cause. I cannot rule out that she may have passed a kidney stone, however, she has no history of this. Patient given information on UTI and Kidney Stones and told to make an appointment if the symptoms return.    I discussed the assessment and treatment plan with the patient. The patient was provided an opportunity to ask questions and all were answered. The patient agreed with the plan and  demonstrated an understanding of the instructions.   The patient was advised to call back or seek an in-person evaluation if the symptoms worsen or if the condition fails to improve as anticipated.  20 minutes spent with chart review, paperwork, and  patient visit.  Orma Render, NP

## 2019-03-06 NOTE — Patient Instructions (Signed)
AZO urinary relief- the medicine in it is called Pyridium- and this works to coat the inside of your bladder. If you begin experiencing the symptoms of lower abdominal pain and/or back pain again, you may take this medication to see if it helps until you can get into the office to be seen.   If this happens again, I would like to see you in person so we can take a urine sample and test it to make sure you are not having a urinary track infection.   Please drink plenty of water while taking the extra fiber so you don't get dehydrated- this can increase your chance of a urinary track infection.   Other ways to decrease your chance of a urinary track infection are to always wipe from front to back with each trip to the bathroom, and pee before and after sex.  I have included information on UTI and Kidney Stones for you.  Please call or send me a message if you have any questions   Urinary Tract Infection, Adult A urinary tract infection (UTI) is an infection of any part of the urinary tract. The urinary tract includes:  The kidneys.  The ureters.  The bladder.  The urethra. These organs make, store, and get rid of pee (urine) in the body. What are the causes? This is caused by germs (bacteria) in your genital area. These germs grow and cause swelling (inflammation) of your urinary tract. What increases the risk? You are more likely to develop this condition if:  You have a small, thin tube (catheter) to drain pee.  You cannot control when you pee or poop (incontinence).  You are female, and: ? You use these methods to prevent pregnancy:  A medicine that kills sperm (spermicide).  A device that blocks sperm (diaphragm). ? You have low levels of a female hormone (estrogen). ? You are pregnant.  You have genes that add to your risk.  You are sexually active.  You take antibiotic medicines.  You have trouble peeing because of: ? A prostate that is bigger than normal, if you  are female. ? A blockage in the part of your body that drains pee from the bladder (urethra). ? A kidney stone. ? A nerve condition that affects your bladder (neurogenic bladder). ? Not getting enough to drink. ? Not peeing often enough.  You have other conditions, such as: ? Diabetes. ? A weak disease-fighting system (immune system). ? Sickle cell disease. ? Gout. ? Injury of the spine. What are the signs or symptoms? Symptoms of this condition include:  Needing to pee right away (urgently).  Peeing often.  Peeing small amounts often.  Pain or burning when peeing.  Blood in the pee.  Pee that smells bad or not like normal.  Trouble peeing.  Pee that is cloudy.  Fluid coming from the vagina, if you are female.  Pain in the belly or lower back. Other symptoms include:  Throwing up (vomiting).  No urge to eat.  Feeling mixed up (confused).  Being tired and grouchy (irritable).  A fever.  Watery poop (diarrhea). How is this treated? This condition may be treated with:  Antibiotic medicine.  Other medicines.  Drinking enough water. Follow these instructions at home:  Medicines  Take over-the-counter and prescription medicines only as told by your doctor.  If you were prescribed an antibiotic medicine, take it as told by your doctor. Do not stop taking it even if you start to feel better. General  instructions  Make sure you: ? Pee until your bladder is empty. ? Do not hold pee for a long time. ? Empty your bladder after sex. ? Wipe from front to back after pooping if you are a female. Use each tissue one time when you wipe.  Drink enough fluid to keep your pee pale yellow.  Keep all follow-up visits as told by your doctor. This is important. Contact a doctor if:  You do not get better after 1-2 days.  Your symptoms go away and then come back. Get help right away if:  You have very bad back pain.  You have very bad pain in your lower  belly.  You have a fever.  You are sick to your stomach (nauseous).  You are throwing up. Summary  A urinary tract infection (UTI) is an infection of any part of the urinary tract.  This condition is caused by germs in your genital area.  There are many risk factors for a UTI. These include having a small, thin tube to drain pee and not being able to control when you pee or poop.  Treatment includes antibiotic medicines for germs.  Drink enough fluid to keep your pee pale yellow. This information is not intended to replace advice given to you by your health care provider. Make sure you discuss any questions you have with your health care provider. Document Revised: 01/27/2018 Document Reviewed: 08/19/2017 Elsevier Patient Education  2020 [place].  Kidney Stones Kidney stones are rock-like masses that form inside of the kidneys. Kidneys are organs that make pee (urine). A kidney stone may move into other parts of the urinary tract, including:  The tubes that connect the kidneys to the bladder (ureters).  The bladder.  The tube that carries urine out of the body (urethra). Kidney stones can cause very bad pain and can block the flow of pee. The stone usually leaves your body (passes) through your pee. You may need to have a doctor take out the stone. What are the causes? Kidney stones may be caused by:  A condition in which certain glands make too much parathyroid hormone (primary hyperparathyroidism).  A buildup of a type of crystals in the bladder made of a chemical called uric acid. The body makes uric acid when you eat certain foods.  Narrowing (stricture) of one or both of the ureters.  A kidney blockage that you were born with.  Past surgery on the kidney or the ureters, such as gastric bypass surgery. What increases the risk? You are more likely to develop this condition if:  You have had a kidney stone in the past.  You have a family history of kidney  stones.  You do not drink enough water.  You eat a diet that is high in protein, salt (sodium), or sugar.  You are overweight or very overweight (obese). What are the signs or symptoms? Symptoms of a kidney stone may include:  Pain in the side of the belly, right below the ribs (flank pain). Pain usually spreads (radiates) to the groin.  Needing to pee often or right away (urgently).  Pain when going pee (urinating).  Blood in your pee (hematuria).  Feeling like you may vomit (nauseous).  Vomiting.  Fever and chills. How is this treated? Treatment depends on the size, location, and makeup of the kidney stones. The stones will often pass out of the body through peeing. You may need to:  Drink more fluid to help pass the stone.  In some cases, you may be given fluids through an IV tube put into one of your veins at the hospital.  Take medicine for pain.  Make changes in your diet to help keep kidney stones from coming back. Sometimes, medical procedures are needed to remove a kidney stone. This may involve:  A procedure to break up kidney stones using a beam of light (laser) or shock waves.  Surgery to remove the kidney stones. Follow these instructions at home: Medicines  Take over-the-counter and prescription medicines only as told by your doctor.  Ask your doctor if the medicine prescribed to you requires you to avoid driving or using heavy machinery. Eating and drinking  Drink enough fluid to keep your pee pale yellow. You may be told to drink at least 8-10 glasses of water each day. This will help you pass the stone.  If told by your doctor, change your diet. This may include: ? Limiting how much salt you eat. ? Eating more fruits and vegetables. ? Limiting how much meat, poultry, fish, and eggs you eat.  Follow instructions from your doctor about eating or drinking restrictions. General instructions  Collect pee samples as told by your doctor. You may need to  collect a pee sample: ? 24 hours after a stone comes out. ? 8-12 weeks after a stone comes out, and every 6-12 months after that.  Strain your pee every time you pee (urinate), for as long as told. Use the strainer that your doctor recommends.  Do not throw out the stone. Keep it so that it can be tested by your doctor.  Keep all follow-up visits as told by your doctor. This is important. You may need follow-up tests. How is this prevented? To prevent another kidney stone:  Drink enough fluid to keep your pee pale yellow. This is the best way to prevent kidney stones.  Eat healthy foods.  Avoid certain foods as told by your doctor. You may be told to eat less protein.  Stay at a healthy weight. Where to find more information  [place] ([facility]): www.kidney.[facility]): www.urologyhealth.org Contact a doctor if:  You have pain that gets worse or does not get better with medicine. Get help right away if:  You have a fever or chills.  You get very bad pain.  You get new pain in your belly (abdomen).  You pass out (faint).  You cannot pee. Summary  Kidney stones are rock-like masses that form inside of the kidneys.  Kidney stones can cause very bad pain and can block the flow of pee.  The stones will often pass out of the body through peeing.  Drink enough fluid to keep your pee pale yellow. This information is not intended to replace advice given to you by your health care provider. Make sure you discuss any questions you have with your health care provider. Document Revised: 06/28/2018 Document Reviewed: 06/28/2018 Elsevier Patient Education  [place].

## 2019-03-30 ENCOUNTER — Inpatient Hospital Stay: Admission: RE | Admit: 2019-03-30 | Payer: No Typology Code available for payment source | Source: Ambulatory Visit

## 2019-03-31 ENCOUNTER — Encounter: Payer: Self-pay | Admitting: Family Medicine

## 2019-03-31 ENCOUNTER — Other Ambulatory Visit: Payer: Self-pay

## 2019-03-31 ENCOUNTER — Ambulatory Visit (INDEPENDENT_AMBULATORY_CARE_PROVIDER_SITE_OTHER): Payer: No Typology Code available for payment source | Admitting: Family Medicine

## 2019-03-31 VITALS — BP 119/74 | HR 98 | Temp 98.2°F | Wt 146.0 lb

## 2019-03-31 DIAGNOSIS — R197 Diarrhea, unspecified: Secondary | ICD-10-CM

## 2019-03-31 DIAGNOSIS — R300 Dysuria: Secondary | ICD-10-CM | POA: Insufficient documentation

## 2019-03-31 LAB — POCT URINALYSIS DIPSTICK
Bilirubin, UA: NEGATIVE
Glucose, UA: NEGATIVE
Ketones, UA: NEGATIVE
Spec Grav, UA: >=1.03 — AB (ref 1.010–1.025)
Urobilinogen, UA: 0.2 E.U./dL
pH, UA: 6 (ref 5.0–8.0)

## 2019-03-31 MED ORDER — CEPHALEXIN 500 MG PO CAPS: 500.0000 mg | ORAL_CAPSULE | Freq: Two times a day (BID) | ORAL | 0 refills | Status: AC

## 2019-03-31 MED ORDER — FLUCONAZOLE 150 MG PO TABS: 150.0000 mg | ORAL_TABLET | Freq: Once | ORAL | 0 refills | Status: AC

## 2019-03-31 NOTE — Assessment & Plan Note (Signed)
UA with leukocytes, small blood.  Will cover empirically with cephalexin and send for culture Will also send in rx for diflucan given complaint of vaginal itching.  Recommend increased fluid intake

## 2019-03-31 NOTE — Progress Notes (Signed)
Julia Carter - 34 y.o. female MRN [id number]  Date of birth: 03-03-1985  Subjective Chief Complaint  Patient presents with  . Diarrhea    x 1 month after eats.  Also has vaginal itching with odor    HPI Julia Carter is a 34 y.o. female with complaint of diarrhea.  She reports that diarrhea started about 1 month ago and has been off and on.  Prior to this she had problems with constipation.  She does experience some abdominal cramping at times.  She has not noticed blood in her stool, dark stools, fever or chills.  Feels better today. Her last BM was this morning which she says was normal.     She also has complaint of burning with urination and vaginal itching.  She has noticed odor to urine.  She denies vaginal discharge or odor.  She has not had associated flank pain.  LMP 03/12/19  ROS:  A comprehensive ROS was completed and negative except as noted per HPI      Allergies  Allergen Reactions  . Crestor [Rosuvastatin]   . Norethindrone Other (See Comments)    Severe cramping/numbness in lower extremities & back. Was unable to walk.  . Other     Some fresh fruits    Past Medical History:  Diagnosis Date  . Elevated cholesterol 2019  . History of PCOS   . Migraine    with aura    No past surgical history on file.  Social History   Socioeconomic History  . Marital status: Married    Spouse name: Not on file  . Number of children: 1  . Years of education: Not on file  . Highest education level: Not on file  Occupational History  . Occupation: AIM Loss adjuster, chartered: [employer]  Tobacco Use  . Smoking status: Passive Smoke Exposure - Never Smoker  . Smokeless tobacco: Never Used  Substance and Sexual Activity  . Alcohol use: Not Currently  . Drug use: Never  . Sexual activity: Yes    Partners: Male    Birth control/protection: None    Comment: trying for pregnancy  Other Topics Concern  . Not on file  Social History Narrative  . Not on file    Social Determinants of Health   Financial Resource Strain:   . Difficulty of Paying Living Expenses: Not on file  Food Insecurity:   . Worried About Charity fundraiser in the Last Year: Not on file  . Ran Out of Food in the Last Year: Not on file  Transportation Needs:   . Lack of Transportation (Medical): Not on file  . Lack of Transportation (Non-Medical): Not on file  Physical Activity:   . Days of Exercise per Week: Not on file  . Minutes of Exercise per Session: Not on file  Stress:   . Feeling of Stress : Not on file  Social Connections:   . Frequency of Communication with Friends and Family: Not on file  . Frequency of Social Gatherings with Friends and Family: Not on file  . Attends Religious Services: Not on file  . Active Member of Clubs or Organizations: Not on file  . Attends Archivist Meetings: Not on file  . Marital Status: Not on file    Family History  Problem Relation Age of Onset  . Arthritis Father   . Polycystic ovary syndrome Sister   . Polycystic ovary syndrome Sister   . Goiter Sister   .  Thyroid disease Sister     Health Maintenance  Topic Date Due  . TETANUS/TDAP  08/27/2005  . PAP SMEAR-Modifier  08/04/2020  . INFLUENZA VACCINE  Completed  . HIV Screening  Completed    ----------------------------------------------------------------------------------------------------------------------------------------------------------------------------------------------------------------- Physical Exam BP 119/74   Pulse 98   Temp 98.2 F (36.8 C) (Oral)   Wt 146 lb (66.2 kg)   SpO2 100%   BMI 25.66 kg/m   Physical Exam Constitutional:      Appearance: Normal appearance.  HENT:     Head: Normocephalic and atraumatic.  Eyes:     General: No scleral icterus. Cardiovascular:     Rate and Rhythm: Normal rate and regular rhythm.  Pulmonary:     Effort: Pulmonary effort is normal.     Breath sounds: Normal breath sounds.  Abdominal:      General: Abdomen is flat. Bowel sounds are normal. There is no distension.     Palpations: Abdomen is soft.     Tenderness: There is no abdominal tenderness. There is no guarding.  Musculoskeletal:     Cervical back: Neck supple.  Skin:    General: Skin is warm and dry.  Neurological:     General: No focal deficit present.     Mental Status: She is alert.  Psychiatric:        Mood and Affect: Mood normal.        Behavior: Behavior normal.     ------------------------------------------------------------------------------------------------------------------------------------------------------------------------------------------------------------------- Assessment and Plan  Dysuria UA with leukocytes, small blood.  Will cover empirically with cephalexin and send for culture Will also send in rx for diflucan given complaint of vaginal itching.  Recommend increased fluid intake  Diarrhea Her current symptoms of diarrhea with prior history of constipation are consistent with IBS etiology.  Given handout.  Discussed FODMAP diet and keeping diary to see if certain foods may trigger her symptoms.   Reassuring that symptoms have improved today. No red flag symptoms in history.  If symptoms return and are persistent we discussed possible doing stool studies but I think we can hold off on these for now.     This visit occurred during the SARS-CoV-2 public health emergency.  Safety protocols were in place, including screening questions prior to the visit, additional usage of staff PPE, and extensive cleaning of exam room while observing appropriate contact time as indicated for disinfecting solutions.

## 2019-03-31 NOTE — Patient Instructions (Signed)
Diet for Irritable Bowel Syndrome  When you have irritable bowel syndrome (IBS), it is very important to eat the foods and follow the eating habits that are best for your condition. IBS may cause various symptoms such as pain in the abdomen, constipation, or diarrhea. Choosing the right foods can help to ease the discomfort from these symptoms. Work with your health care provider and diet and nutrition specialist (dietitian) to find the eating plan that will help to control your symptoms. What are tips for following this plan?      Keep a food diary. This will help you identify foods that cause symptoms. Write down: ? What you eat and when you eat it. ? What symptoms you have. ? When symptoms occur in relation to your meals, such as "pain in abdomen 2 hours after dinner."  Eat your meals slowly and in a relaxed setting.  Aim to eat 5-6 small meals per day. Do not skip meals.  Drink enough fluid to keep your urine pale yellow.  Ask your health care provider if you should take an over-the-counter probiotic to help restore healthy bacteria in your gut (digestive tract). ? Probiotics are foods that contain good bacteria and yeasts.  Your dietitian may have specific dietary recommendations for you based on your symptoms. He or she may recommend that you: ? Avoid foods that cause symptoms. Talk with your dietitian about other ways to get the same nutrients that are in those problem foods. ? Avoid foods with gluten. Gluten is a protein that is found in rye, wheat, and barley. ? Eat more foods that contain soluble fiber. Examples of foods with high soluble fiber include oats, seeds, and certain fruits and vegetables. Take a fiber supplement if directed by your dietitian. ? Reduce or avoid certain foods called FODMAPs. These are foods that contain carbohydrates that are hard to digest. Ask your doctor which foods contain these carbohydrates. What foods are not recommended? The following are some  foods and drinks that may make your symptoms worse:  Fatty foods, such as french fries.  Foods that contain gluten, such as pasta and cereal.  Dairy products, such as milk, cheese, and ice cream.  Chocolate.  Alcohol.  Products with caffeine, such as coffee.  Carbonated drinks, such as soda.  Foods that are high in FODMAPs. These include certain fruits and vegetables.  Products with sweeteners such as honey, high fructose corn syrup, sorbitol, and mannitol. The items listed above may not be a complete list of foods and beverages you should avoid. Contact a dietitian for more information. What foods are good sources of fiber? Your health care provider or dietitian may recommend that you eat more foods that contain fiber. Fiber can help to reduce constipation and other IBS symptoms. Add foods with fiber to your diet a little at a time so your body can get used to them. Too much fiber at one time might cause gas and swelling of your abdomen. The following are some foods that are good sources of fiber:  Berries, such as raspberries, strawberries, and blueberries.  Tomatoes.  Carrots.  Brown rice.  Oats.  Seeds, such as chia and pumpkin seeds. The items listed above may not be a complete list of recommended sources of fiber. Contact your dietitian for more options. Where to find more information  International Foundation for Functional Gastrointestinal Disorders: www.iffgd.org  National Institute of Diabetes and Digestive and Kidney Diseases: www.niddk.nih.gov Summary  When you have irritable bowel syndrome (IBS), it   is very important to eat the foods and follow the eating habits that are best for your condition.  IBS may cause various symptoms such as pain in the abdomen, constipation, or diarrhea.  Choosing the right foods can help to ease the discomfort that comes from symptoms.  Keep a food diary. This will help you identify foods that cause symptoms.  Your health  care provider or diet and nutrition specialist (dietitian) may recommend that you eat more foods that contain fiber. This information is not intended to replace advice given to you by your health care provider. Make sure you discuss any questions you have with your health care provider. Document Revised: 06/01/2018 Document Reviewed: 10/13/2016 Elsevier Patient Education  2020 [employer]  

## 2019-03-31 NOTE — Assessment & Plan Note (Signed)
Her current symptoms of diarrhea with prior history of constipation are consistent with IBS etiology.  Given handout.  Discussed FODMAP diet and keeping diary to see if certain foods may trigger her symptoms.   Reassuring that symptoms have improved today. No red flag symptoms in history.  If symptoms return and are persistent we discussed possible doing stool studies but I think we can hold off on these for now.

## 2019-04-01 LAB — TIQ- MISLABELED: Test Ordered On Req: 395

## 2019-04-03 ENCOUNTER — Telehealth: Payer: Self-pay

## 2019-04-03 NOTE — Telephone Encounter (Signed)
Ok. Thanks. I will close this telephone note.

## 2019-04-03 NOTE — Telephone Encounter (Signed)
[facility] called and stated that they received a specimen with a one name on the container and a different name on the req/order. I advised it was the name on the req/order. They will be faxing over a form to sign due to the mislabel. I can sign the form or Orlie Dakin, LPN if she is her.

## 2019-04-03 NOTE — Telephone Encounter (Signed)
I believe Apolonio Schneiders took care of this already.  I'll add her to this as well and send you the original from this morning. Thanks!  CM

## 2019-04-03 NOTE — Progress Notes (Signed)
Specimen mislabeled.

## 2019-04-03 NOTE — Progress Notes (Signed)
FYI

## 2019-04-07 LAB — URINE CULTURE
MICRO NUMBER:: 10146755
SPECIMEN QUALITY:: ADEQUATE

## 2019-04-07 LAB — PAT ID TIQ DOC: Test Affected: 395

## 2019-04-07 LAB — TEST AUTHORIZATION

## 2019-04-07 NOTE — Telephone Encounter (Signed)
FYI, still have not received urine culture result.  Thanks!

## 2019-04-07 NOTE — Telephone Encounter (Signed)
I have contacted [facility] again, test is in process

## 2019-04-10 NOTE — Telephone Encounter (Signed)
Received results, placed in Dr West Pugh box

## 2019-11-23 ENCOUNTER — Ambulatory Visit: Payer: No Typology Code available for payment source | Admitting: Obstetrics and Gynecology

## 2020-09-20 IMAGING — DX DG CHEST 2V
2 series · 2 of 2 positions shown · non-contrast
Comparison: None.

CLINICAL DATA: Chest pain.

EXAM:
CHEST - 2 VIEW

[chest pa]
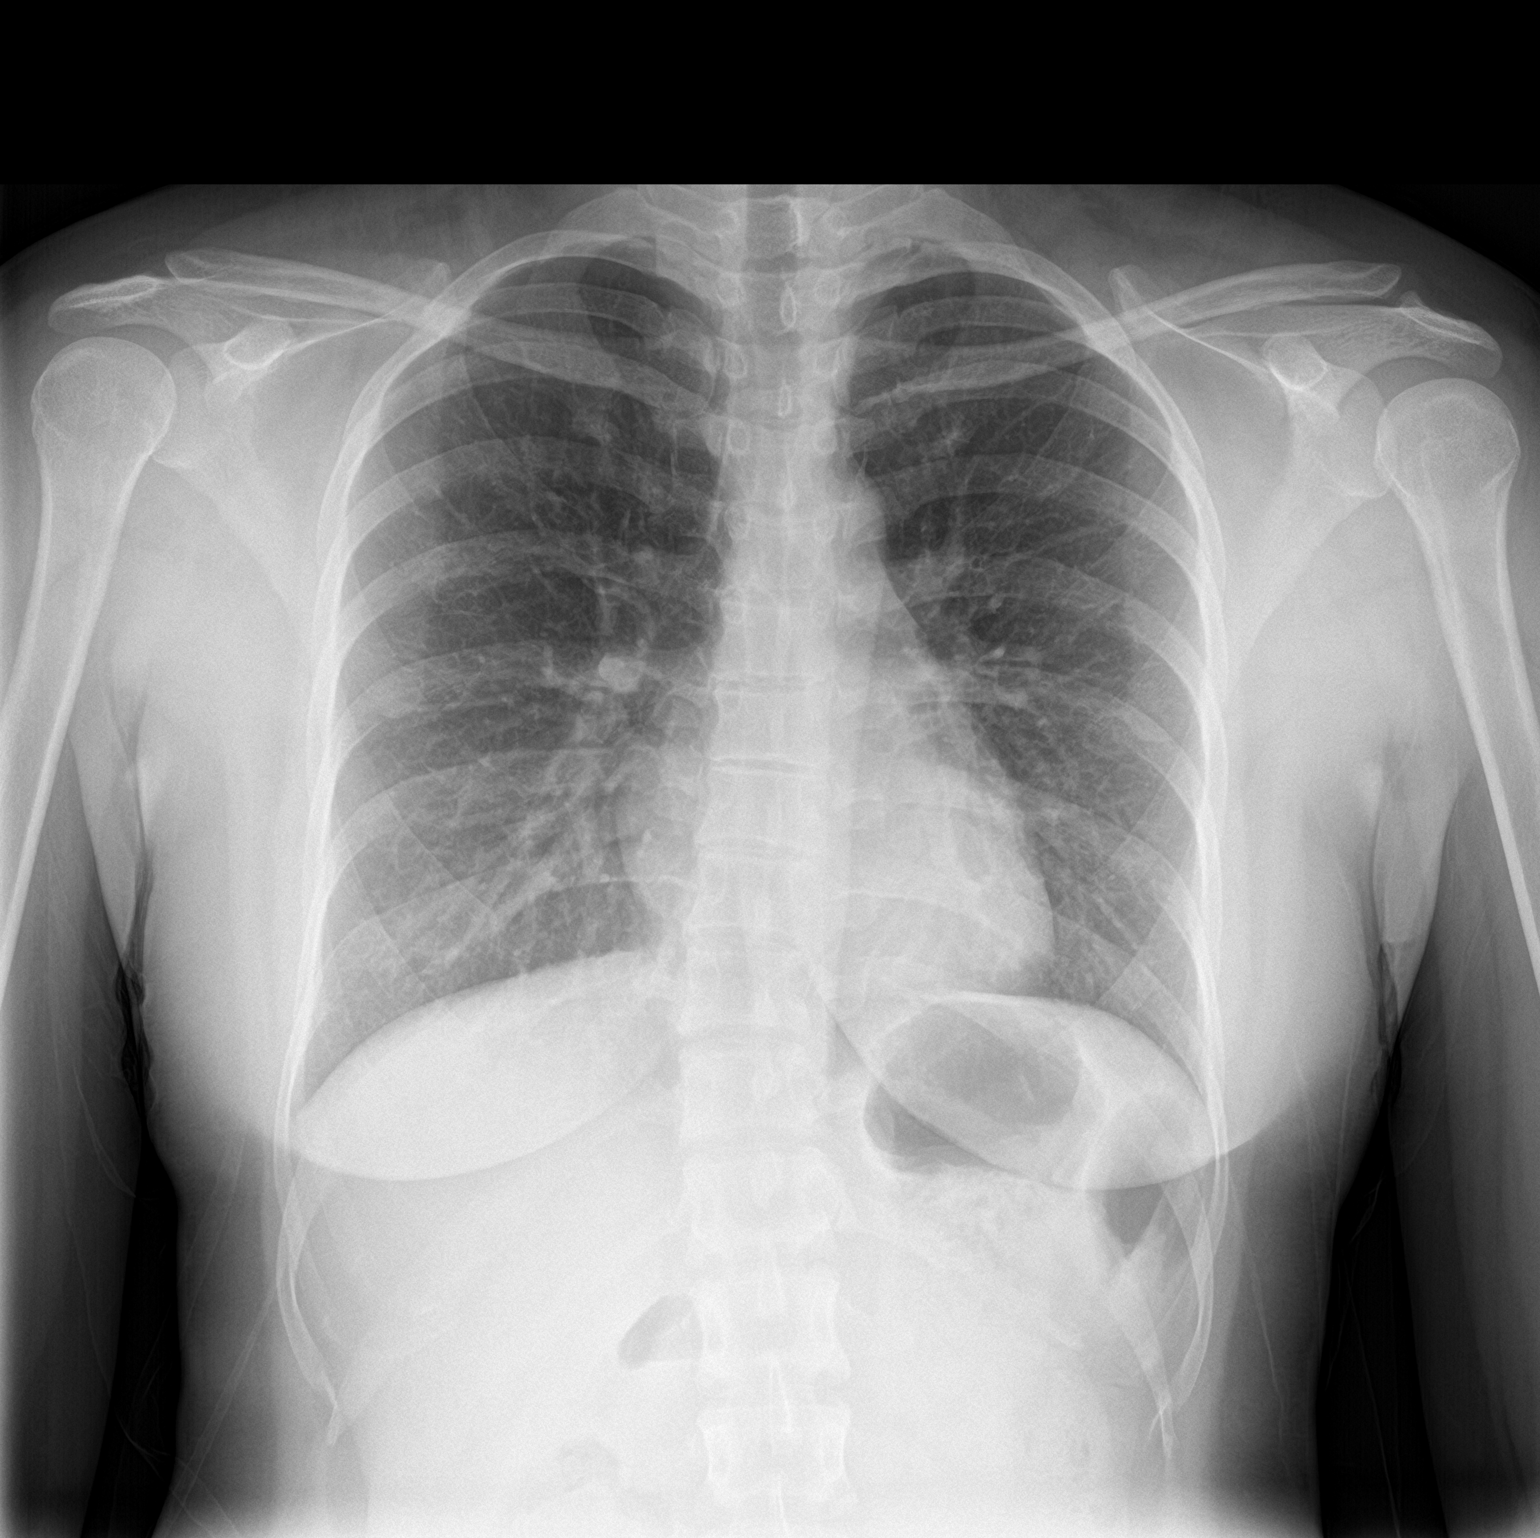

[chest lat]
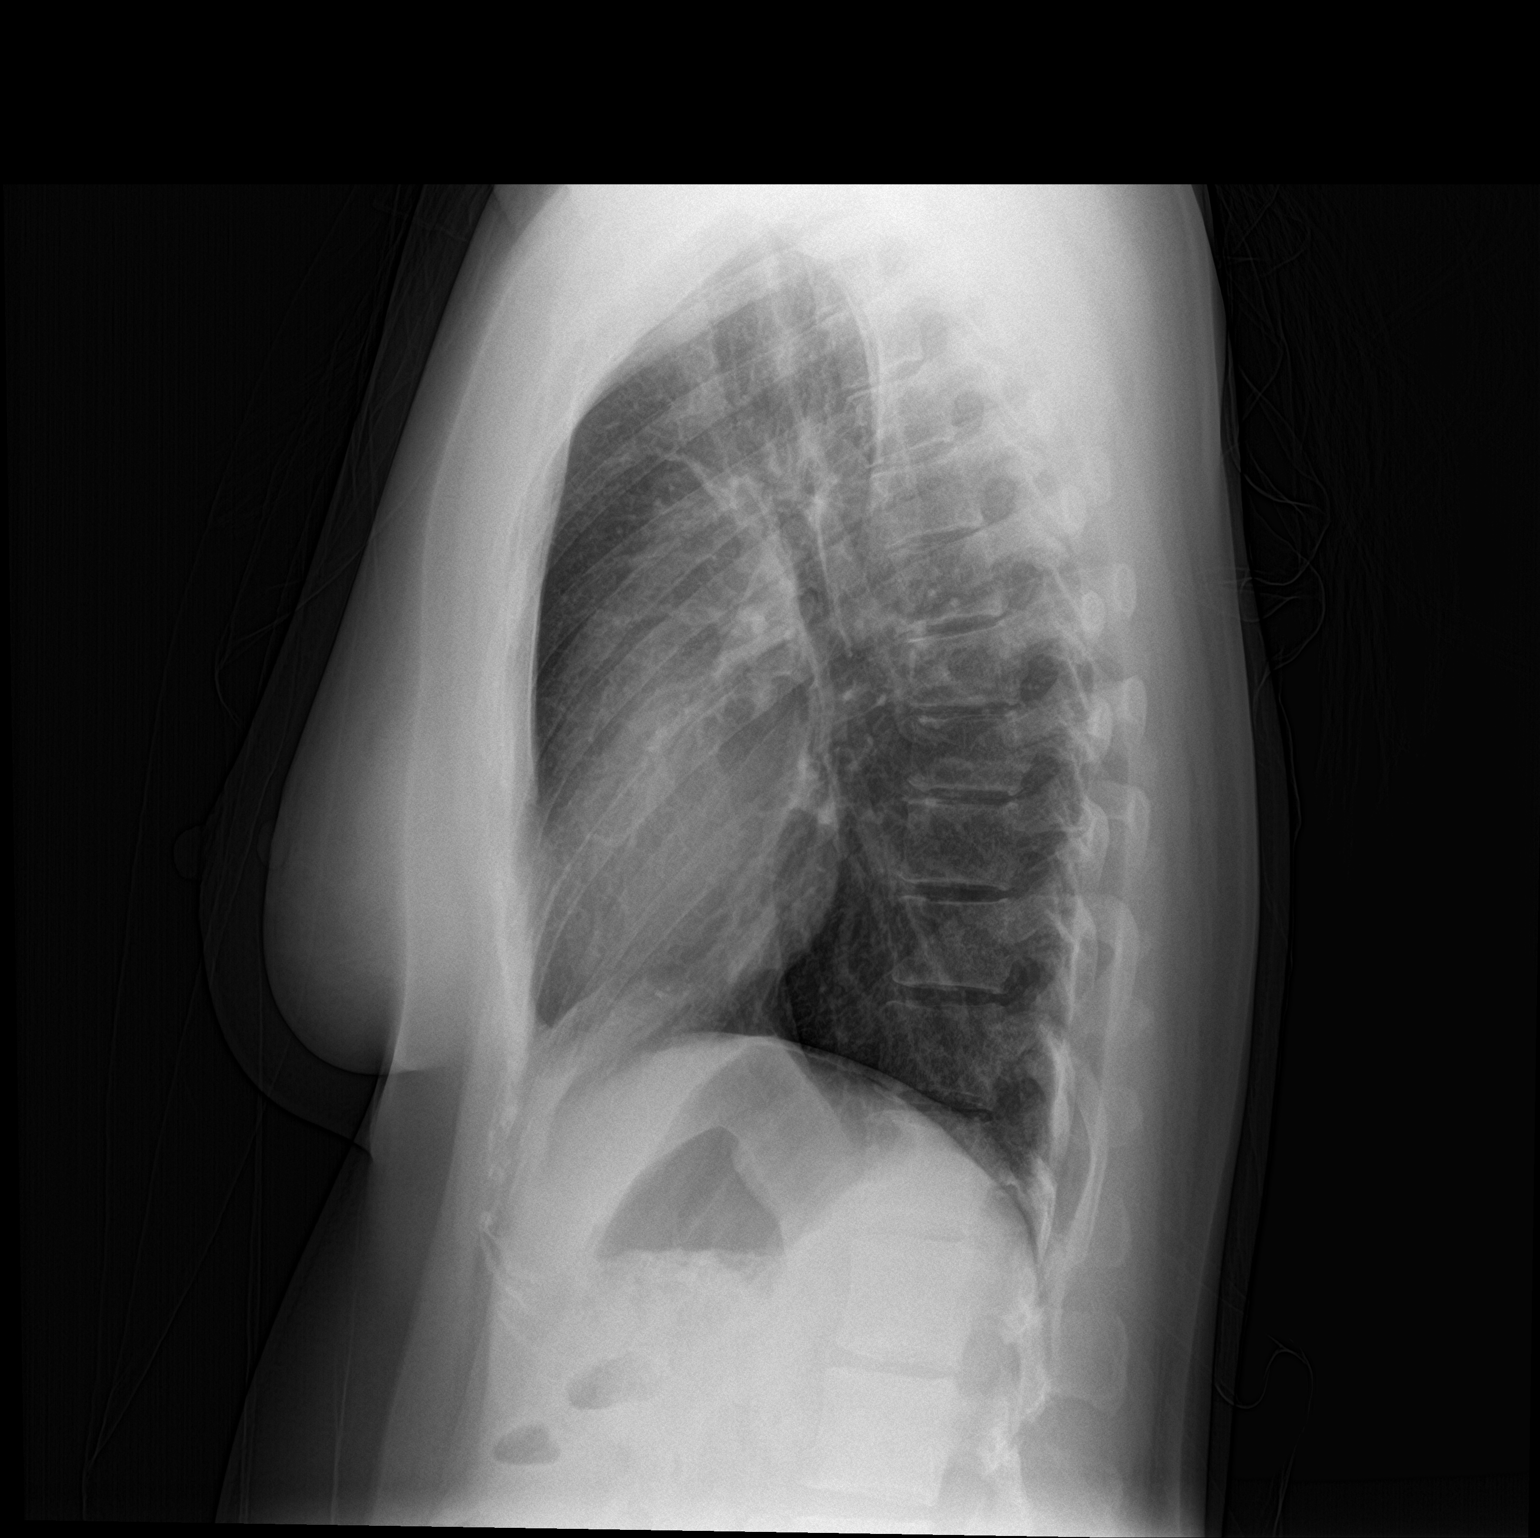

[2 of 2 positions shown; findings below may reference images not displayed]

FINDINGS: The heart size and mediastinal contours are within normal limits.
Both lungs are clear. The visualized skeletal structures are
unremarkable.
IMPRESSION: Normal exam.

## 2021-01-22 ENCOUNTER — Telehealth: Payer: Self-pay

## 2021-01-22 NOTE — Telephone Encounter (Signed)
Transition Care Management Unsuccessful Follow-up Telephone Call  Date of discharge and from where:  01/21/2021 from [facility]  Attempts:  1st Attempt  Reason for unsuccessful TCM follow-up call:  Left voice message

## 2021-01-22 NOTE — Telephone Encounter (Signed)
Transition Care Management Follow-up Telephone Call Date of discharge and from where: 01/21/2021 from [facility] How have you been since you were released from the hospital? Pt presented to ER at [facility] for SOB and CP that radiates to her back. Pt stated that she is does not have CP now and her breathing is much better. Pt did not feel that she is in need of medical attention at this time. Pt did not wait in the ER.  Any questions or concerns? No  Items Reviewed: Did the pt receive and understand the discharge instructions provided? Yes  Medications obtained and verified? Yes  Other? No  Any new allergies since your discharge? No  Dietary orders reviewed? No Do you have support at home? Yes   Functional Questionnaire: (I = Independent and D = Dependent) ADLs: I  Bathing/Dressing- I  Meal Prep- I  Eating- I  Maintaining continence- I  Transferring/Ambulation- I  Managing Meds- I   Follow up appointments reviewed:  PCP Hospital f/u appt confirmed? No   Specialist Hospital f/u appt confirmed? No   Are transportation arrangements needed? No  If their condition worsens, is the pt aware to call PCP or go to the Emergency Dept.? Yes Was the patient provided with contact information for the PCP's office or ED? Yes Was to pt encouraged to call back with questions or concerns? Yes
